# Patient Record
Sex: Female | Born: 1945
Health system: Southern US, Community
[De-identification: ages and names within clinical notes are randomized; demographics above are authoritative.]

## PROBLEM LIST (undated history)

## (undated) DIAGNOSIS — Z6826 Body mass index (BMI) 26.0-26.9, adult: Secondary | ICD-10-CM

## (undated) DIAGNOSIS — I272 Pulmonary hypertension, unspecified: Secondary | ICD-10-CM

## (undated) DIAGNOSIS — J449 Chronic obstructive pulmonary disease, unspecified: Secondary | ICD-10-CM

## (undated) DIAGNOSIS — F419 Anxiety disorder, unspecified: Secondary | ICD-10-CM

## (undated) DIAGNOSIS — I4891 Unspecified atrial fibrillation: Secondary | ICD-10-CM

## (undated) DIAGNOSIS — I10 Essential (primary) hypertension: Secondary | ICD-10-CM

## (undated) DIAGNOSIS — E039 Hypothyroidism, unspecified: Secondary | ICD-10-CM

## (undated) DIAGNOSIS — E785 Hyperlipidemia, unspecified: Secondary | ICD-10-CM

## (undated) DIAGNOSIS — N189 Chronic kidney disease, unspecified: Secondary | ICD-10-CM

## (undated) DIAGNOSIS — I96 Gangrene, not elsewhere classified: Secondary | ICD-10-CM

## (undated) DIAGNOSIS — R600 Localized edema: Secondary | ICD-10-CM

## (undated) DIAGNOSIS — I509 Heart failure, unspecified: Secondary | ICD-10-CM

## (undated) HISTORY — DX: Gangrene, not elsewhere classified: I96

## (undated) HISTORY — DX: Chronic kidney disease, unspecified: N18.9

## (undated) HISTORY — DX: Localized edema: R60.0

## (undated) HISTORY — DX: Chronic obstructive pulmonary disease, unspecified: J44.9

## (undated) HISTORY — DX: Hyperlipidemia, unspecified: E78.5

## (undated) HISTORY — PX: OTHER SURGICAL HISTORY: SHX169

## (undated) HISTORY — DX: Heart failure, unspecified: I50.9

## (undated) HISTORY — PX: ABDOMINAL HYSTERECTOMY: SHX81

## (undated) HISTORY — DX: Unspecified atrial fibrillation: I48.91

## (undated) HISTORY — PX: COLONOSCOPY: SHX174

## (undated) HISTORY — DX: Body mass index (BMI) 26.0-26.9, adult: Z68.26

## (undated) HISTORY — DX: Essential (primary) hypertension: I10

## (undated) HISTORY — DX: Hypothyroidism, unspecified: E03.9

## (undated) HISTORY — DX: Anxiety disorder, unspecified: F41.9

## (undated) HISTORY — DX: Pulmonary hypertension, unspecified: I27.20

---

## 2003-10-16 DIAGNOSIS — C50919 Malignant neoplasm of unspecified site of unspecified female breast: Secondary | ICD-10-CM

## 2003-10-16 HISTORY — PX: BREAST LUMPECTOMY: SHX2

## 2003-10-16 HISTORY — DX: Malignant neoplasm of unspecified site of unspecified female breast: C50.919

## 2018-12-23 DIAGNOSIS — Z6823 Body mass index (BMI) 23.0-23.9, adult: Secondary | ICD-10-CM | POA: Diagnosis not present

## 2018-12-23 DIAGNOSIS — J449 Chronic obstructive pulmonary disease, unspecified: Secondary | ICD-10-CM | POA: Diagnosis not present

## 2018-12-23 DIAGNOSIS — I10 Essential (primary) hypertension: Secondary | ICD-10-CM | POA: Diagnosis not present

## 2019-03-26 DIAGNOSIS — Z0000 Encounter for general adult medical examination without abnormal findings: Secondary | ICD-10-CM | POA: Diagnosis not present

## 2019-03-26 DIAGNOSIS — Z6823 Body mass index (BMI) 23.0-23.9, adult: Secondary | ICD-10-CM | POA: Diagnosis not present

## 2019-03-26 DIAGNOSIS — Z1389 Encounter for screening for other disorder: Secondary | ICD-10-CM | POA: Diagnosis not present

## 2019-03-26 DIAGNOSIS — I10 Essential (primary) hypertension: Secondary | ICD-10-CM | POA: Diagnosis not present

## 2019-03-26 DIAGNOSIS — J301 Allergic rhinitis due to pollen: Secondary | ICD-10-CM | POA: Diagnosis not present

## 2019-03-26 DIAGNOSIS — R69 Illness, unspecified: Secondary | ICD-10-CM | POA: Diagnosis not present

## 2019-03-26 DIAGNOSIS — J449 Chronic obstructive pulmonary disease, unspecified: Secondary | ICD-10-CM | POA: Diagnosis not present

## 2019-03-27 DIAGNOSIS — J449 Chronic obstructive pulmonary disease, unspecified: Secondary | ICD-10-CM | POA: Diagnosis not present

## 2019-03-27 DIAGNOSIS — I10 Essential (primary) hypertension: Secondary | ICD-10-CM | POA: Diagnosis not present

## 2019-03-27 DIAGNOSIS — Z131 Encounter for screening for diabetes mellitus: Secondary | ICD-10-CM | POA: Diagnosis not present

## 2019-03-27 DIAGNOSIS — Z0000 Encounter for general adult medical examination without abnormal findings: Secondary | ICD-10-CM | POA: Diagnosis not present

## 2019-06-25 DIAGNOSIS — I10 Essential (primary) hypertension: Secondary | ICD-10-CM | POA: Diagnosis not present

## 2019-06-25 DIAGNOSIS — R69 Illness, unspecified: Secondary | ICD-10-CM | POA: Diagnosis not present

## 2019-06-25 DIAGNOSIS — Z6824 Body mass index (BMI) 24.0-24.9, adult: Secondary | ICD-10-CM | POA: Diagnosis not present

## 2019-06-25 DIAGNOSIS — J449 Chronic obstructive pulmonary disease, unspecified: Secondary | ICD-10-CM | POA: Diagnosis not present

## 2019-08-10 DIAGNOSIS — R69 Illness, unspecified: Secondary | ICD-10-CM | POA: Diagnosis not present

## 2019-09-24 DIAGNOSIS — R69 Illness, unspecified: Secondary | ICD-10-CM | POA: Diagnosis not present

## 2019-09-24 DIAGNOSIS — I10 Essential (primary) hypertension: Secondary | ICD-10-CM | POA: Diagnosis not present

## 2019-09-24 DIAGNOSIS — J449 Chronic obstructive pulmonary disease, unspecified: Secondary | ICD-10-CM | POA: Diagnosis not present

## 2019-09-24 DIAGNOSIS — Z6824 Body mass index (BMI) 24.0-24.9, adult: Secondary | ICD-10-CM | POA: Diagnosis not present

## 2019-12-23 DIAGNOSIS — R69 Illness, unspecified: Secondary | ICD-10-CM | POA: Diagnosis not present

## 2019-12-23 DIAGNOSIS — Z6825 Body mass index (BMI) 25.0-25.9, adult: Secondary | ICD-10-CM | POA: Diagnosis not present

## 2019-12-23 DIAGNOSIS — J449 Chronic obstructive pulmonary disease, unspecified: Secondary | ICD-10-CM | POA: Diagnosis not present

## 2019-12-23 DIAGNOSIS — I10 Essential (primary) hypertension: Secondary | ICD-10-CM | POA: Diagnosis not present

## 2019-12-25 DIAGNOSIS — R69 Illness, unspecified: Secondary | ICD-10-CM | POA: Diagnosis not present

## 2019-12-25 DIAGNOSIS — J449 Chronic obstructive pulmonary disease, unspecified: Secondary | ICD-10-CM | POA: Diagnosis not present

## 2019-12-25 DIAGNOSIS — Z1159 Encounter for screening for other viral diseases: Secondary | ICD-10-CM | POA: Diagnosis not present

## 2019-12-25 DIAGNOSIS — I10 Essential (primary) hypertension: Secondary | ICD-10-CM | POA: Diagnosis not present

## 2020-03-23 DIAGNOSIS — J449 Chronic obstructive pulmonary disease, unspecified: Secondary | ICD-10-CM | POA: Diagnosis not present

## 2020-03-23 DIAGNOSIS — R69 Illness, unspecified: Secondary | ICD-10-CM | POA: Diagnosis not present

## 2020-03-23 DIAGNOSIS — I10 Essential (primary) hypertension: Secondary | ICD-10-CM | POA: Diagnosis not present

## 2020-03-23 DIAGNOSIS — Z6825 Body mass index (BMI) 25.0-25.9, adult: Secondary | ICD-10-CM | POA: Diagnosis not present

## 2020-06-08 DIAGNOSIS — J449 Chronic obstructive pulmonary disease, unspecified: Secondary | ICD-10-CM | POA: Diagnosis not present

## 2020-06-08 DIAGNOSIS — R69 Illness, unspecified: Secondary | ICD-10-CM | POA: Diagnosis not present

## 2020-06-08 DIAGNOSIS — I10 Essential (primary) hypertension: Secondary | ICD-10-CM | POA: Diagnosis not present

## 2020-06-08 DIAGNOSIS — Z6826 Body mass index (BMI) 26.0-26.9, adult: Secondary | ICD-10-CM | POA: Diagnosis not present

## 2020-06-08 DIAGNOSIS — R600 Localized edema: Secondary | ICD-10-CM | POA: Diagnosis not present

## 2020-06-21 DIAGNOSIS — M79661 Pain in right lower leg: Secondary | ICD-10-CM | POA: Diagnosis not present

## 2020-06-21 DIAGNOSIS — M79604 Pain in right leg: Secondary | ICD-10-CM | POA: Diagnosis not present

## 2020-06-21 DIAGNOSIS — M7989 Other specified soft tissue disorders: Secondary | ICD-10-CM | POA: Diagnosis not present

## 2020-07-05 ENCOUNTER — Encounter: Payer: Self-pay | Admitting: Vascular Surgery

## 2020-07-15 ENCOUNTER — Other Ambulatory Visit: Payer: Self-pay

## 2020-07-15 DIAGNOSIS — I739 Peripheral vascular disease, unspecified: Secondary | ICD-10-CM

## 2020-07-19 ENCOUNTER — Other Ambulatory Visit: Payer: Self-pay

## 2020-07-19 ENCOUNTER — Ambulatory Visit (HOSPITAL_COMMUNITY)
Admission: RE | Admit: 2020-07-19 | Discharge: 2020-07-19 | Disposition: A | Discharge status: Home / Self Care | Payer: Medicare HMO | Source: Ambulatory Visit | Attending: Vascular Surgery | Admitting: Vascular Surgery

## 2020-07-19 ENCOUNTER — Other Ambulatory Visit: Payer: Self-pay | Admitting: Vascular Surgery

## 2020-07-19 DIAGNOSIS — I739 Peripheral vascular disease, unspecified: Secondary | ICD-10-CM | POA: Insufficient documentation

## 2020-07-25 ENCOUNTER — Other Ambulatory Visit: Payer: Self-pay

## 2020-07-25 ENCOUNTER — Encounter: Payer: Self-pay | Admitting: Vascular Surgery

## 2020-07-25 ENCOUNTER — Ambulatory Visit: Payer: Medicare HMO | Admitting: Vascular Surgery

## 2020-07-25 VITALS — BP 143/71 | HR 82 | Temp 98.1°F | Resp 18 | Ht 61.0 in | Wt 138.0 lb

## 2020-07-25 DIAGNOSIS — I739 Peripheral vascular disease, unspecified: Secondary | ICD-10-CM

## 2020-07-25 NOTE — Progress Notes (Signed)
[facility] [place]  Patient name: Stacy Johnson MRN: [id number] DOB: 19-May-1946 Sex: female  REASON FOR CONSULT: Evaluation of right lower extremity swelling and bilateral peripheral vascular occlusive disease  HPI: Stacy Johnson is a 74 y.o. female, who is here today for evaluation of lower extremity arterial disease.  She reports having hip fracture and pinning several years ago and reports that she has had some swelling ever since.  This is been in her right leg only.  This seems to been progressive.  She recently underwent duplex showing normal venous studies with no evidence of DVT.  She also underwent lower extremity arterial evaluation with ankle arm indices at [facility] on 07/19/2020.  This revealed a resting ankle arm index of 0.67 on the right and 0.58 on the left.  There was moderate drop with exercise.  She is here today for further discussion  Past Medical History:  Diagnosis Date  . Anxiety disorder   . Body mass index 26.0-26.9, adult   . Chronic obstructive pulmonary disease (COPD) (HCC)   . Gangrene (HCC)   . Hypertension   . Localized edema     History reviewed. No pertinent family history.  SOCIAL HISTORY: Social History   Socioeconomic History  . Marital status: Widowed    Spouse name: Not on file  . Number of children: Not on file  . Years of education: Not on file  . Highest education level: Not on file  Occupational History  . Not on file  Tobacco Use  . Smoking status: Former Smoker    Types: Cigarettes    Quit date: 10/16/2003    Years since quitting: 16.7  . Smokeless tobacco: Never Used  Substance and Sexual Activity  . Alcohol use: Not Currently  . Drug use: Never  . Sexual activity: Not on file  Other Topics Concern  . Not on file  Social History Narrative  . Not on file   Social Determinants of Health   Financial Resource Strain:   . Difficulty of Paying Living  Expenses: Not on file  Food Insecurity:   . Worried About Programme researcher, broadcasting/film/video in the Last Year: Not on file  . Ran Out of Food in the Last Year: Not on file  Transportation Needs:   . Lack of Transportation (Medical): Not on file  . Lack of Transportation (Non-Medical): Not on file  Physical Activity:   . Days of Exercise per Week: Not on file  . Minutes of Exercise per Session: Not on file  Stress:   . Feeling of Stress : Not on file  Social Connections:   . Frequency of Communication with Friends and Family: Not on file  . Frequency of Social Gatherings with Friends and Family: Not on file  . Attends Religious Services: Not on file  . Active Member of Clubs or Organizations: Not on file  . Attends Banker Meetings: Not on file  . Marital Status: Not on file  Intimate Partner Violence:   . Fear of Current or Ex-Partner: Not on file  . Emotionally Abused: Not on file  . Physically Abused: Not on file  . Sexually Abused: Not on file    No Known Allergies  Current Outpatient Medications  Medication Sig Dispense Refill  . amLODipine (NORVASC) 10 MG tablet     . atorvastatin (LIPITOR) 40 MG tablet Take 40 mg by mouth daily.    . diazepam (VALIUM) 2 MG tablet Take  2 mg by mouth every 12 (twelve) hours as needed for anxiety.    . Fluticasone-Umeclidin-Vilant (TRELEGY ELLIPTA) 100-62.5-25 MCG/INH AEPB Inhale into the lungs.    . irbesartan (AVAPRO) 300 MG tablet      No current facility-administered medications for this visit.    REVIEW OF SYSTEMS:  [X]  denotes positive finding, [ ]  denotes negative finding Cardiac  Comments:  Chest pain or chest pressure:    Shortness of breath upon exertion:    Short of breath when lying flat:    Irregular heart rhythm:        Vascular    Pain in calf, thigh, or hip brought on by ambulation:    Pain in feet at night that wakes you up from your sleep:     Blood clot in your veins:    Leg swelling:  x       Pulmonary      Oxygen at home:    Productive cough:     Wheezing:         Neurologic    Sudden weakness in arms or legs:     Sudden numbness in arms or legs:     Sudden onset of difficulty speaking or slurred speech:    Temporary loss of vision in one eye:     Problems with dizziness:         Gastrointestinal    Blood in stool:     Vomited blood:         Genitourinary    Burning when urinating:     Blood in urine:        Psychiatric    Major depression:         Hematologic    Bleeding problems:    Problems with blood clotting too easily:        Skin    Rashes or ulcers:        Constitutional    Fever or chills:      PHYSICAL EXAM: Vitals:   07/25/20 1405  BP: (!) 143/71  Pulse: 82  Resp: 18  Temp: 98.1 F (36.7 C)  TempSrc: Other (Comment)  SpO2: 92%  Weight: 138 lb (62.6 kg)  Height: 5\' 1"  (1.549 m)    GENERAL: The patient is a well-nourished female, in no acute distress. The vital signs are documented above. CARDIOVASCULAR: 2+ radial pulses bilaterally.  2+ femoral pulses bilaterally.  I do not palpate distal pulses. PULMONARY: There is good air exchange  ABDOMEN: Soft and non-tender  MUSCULOSKELETAL: There are no major deformities or cyanosis. NEUROLOGIC: No focal weakness or paresthesias are detected. SKIN: There are no ulcers or rashes noted. PSYCHIATRIC: The patient has a normal affect.  DATA:  Noninvasive studies noted above with no evidence of DVT with recent venous duplex  Right ABI 0.67 and left 0.58  MEDICAL ISSUES: Unclear as to the etiology of her right leg swelling.  No evidence of DVT but probably does have some element of valvular incompetence.  I did explain the option of compression garment if this is progressive.  She reports that this has somewhat improved.  Regarding her arterial insufficiency.  She has no claudication symptoms and no arterial rest pain.  I explained the signs and symptoms of progressive arterial disease.  She no longer smokes  cigarettes.  She will continue her walking program.  She will see again on an as-needed basis   09/24/20, MD FACS [facility] Tel [phone] Pager [phone]

## 2020-08-18 DIAGNOSIS — R69 Illness, unspecified: Secondary | ICD-10-CM | POA: Diagnosis not present

## 2020-09-19 DIAGNOSIS — J13 Pneumonia due to Streptococcus pneumoniae: Secondary | ICD-10-CM | POA: Diagnosis not present

## 2020-09-19 DIAGNOSIS — E7849 Other hyperlipidemia: Secondary | ICD-10-CM | POA: Diagnosis not present

## 2020-09-19 DIAGNOSIS — J449 Chronic obstructive pulmonary disease, unspecified: Secondary | ICD-10-CM | POA: Diagnosis not present

## 2020-09-19 DIAGNOSIS — Z6827 Body mass index (BMI) 27.0-27.9, adult: Secondary | ICD-10-CM | POA: Diagnosis not present

## 2020-09-19 DIAGNOSIS — R600 Localized edema: Secondary | ICD-10-CM | POA: Diagnosis not present

## 2020-09-19 DIAGNOSIS — I10 Essential (primary) hypertension: Secondary | ICD-10-CM | POA: Diagnosis not present

## 2020-09-19 DIAGNOSIS — R69 Illness, unspecified: Secondary | ICD-10-CM | POA: Diagnosis not present

## 2020-09-19 DIAGNOSIS — J440 Chronic obstructive pulmonary disease with acute lower respiratory infection: Secondary | ICD-10-CM | POA: Diagnosis not present

## 2020-12-13 DIAGNOSIS — K219 Gastro-esophageal reflux disease without esophagitis: Secondary | ICD-10-CM | POA: Diagnosis not present

## 2020-12-13 DIAGNOSIS — E785 Hyperlipidemia, unspecified: Secondary | ICD-10-CM | POA: Diagnosis not present

## 2020-12-13 DIAGNOSIS — R630 Anorexia: Secondary | ICD-10-CM | POA: Diagnosis not present

## 2020-12-13 DIAGNOSIS — R7303 Prediabetes: Secondary | ICD-10-CM | POA: Diagnosis not present

## 2020-12-13 DIAGNOSIS — J449 Chronic obstructive pulmonary disease, unspecified: Secondary | ICD-10-CM | POA: Diagnosis not present

## 2020-12-13 DIAGNOSIS — R0902 Hypoxemia: Secondary | ICD-10-CM | POA: Diagnosis not present

## 2020-12-13 DIAGNOSIS — H9202 Otalgia, left ear: Secondary | ICD-10-CM | POA: Diagnosis not present

## 2020-12-13 DIAGNOSIS — E559 Vitamin D deficiency, unspecified: Secondary | ICD-10-CM | POA: Diagnosis not present

## 2020-12-13 DIAGNOSIS — H6120 Impacted cerumen, unspecified ear: Secondary | ICD-10-CM | POA: Diagnosis not present

## 2020-12-15 DIAGNOSIS — J9601 Acute respiratory failure with hypoxia: Secondary | ICD-10-CM | POA: Diagnosis not present

## 2020-12-15 DIAGNOSIS — J449 Chronic obstructive pulmonary disease, unspecified: Secondary | ICD-10-CM | POA: Diagnosis not present

## 2020-12-20 DIAGNOSIS — N183 Chronic kidney disease, stage 3 unspecified: Secondary | ICD-10-CM | POA: Diagnosis not present

## 2020-12-20 DIAGNOSIS — R630 Anorexia: Secondary | ICD-10-CM | POA: Diagnosis not present

## 2020-12-20 DIAGNOSIS — R0902 Hypoxemia: Secondary | ICD-10-CM | POA: Diagnosis not present

## 2020-12-20 DIAGNOSIS — E876 Hypokalemia: Secondary | ICD-10-CM | POA: Diagnosis not present

## 2020-12-20 DIAGNOSIS — E871 Hypo-osmolality and hyponatremia: Secondary | ICD-10-CM | POA: Diagnosis not present

## 2020-12-20 DIAGNOSIS — I10 Essential (primary) hypertension: Secondary | ICD-10-CM | POA: Diagnosis not present

## 2020-12-20 DIAGNOSIS — J449 Chronic obstructive pulmonary disease, unspecified: Secondary | ICD-10-CM | POA: Diagnosis not present

## 2020-12-20 DIAGNOSIS — Z7689 Persons encountering health services in other specified circumstances: Secondary | ICD-10-CM | POA: Diagnosis not present

## 2020-12-20 DIAGNOSIS — H9202 Otalgia, left ear: Secondary | ICD-10-CM | POA: Diagnosis not present

## 2020-12-20 DIAGNOSIS — K219 Gastro-esophageal reflux disease without esophagitis: Secondary | ICD-10-CM | POA: Diagnosis not present

## 2021-01-15 DIAGNOSIS — J9601 Acute respiratory failure with hypoxia: Secondary | ICD-10-CM | POA: Diagnosis not present

## 2021-01-15 DIAGNOSIS — J449 Chronic obstructive pulmonary disease, unspecified: Secondary | ICD-10-CM | POA: Diagnosis not present

## 2021-01-19 DIAGNOSIS — R000 Tachycardia, unspecified: Secondary | ICD-10-CM | POA: Diagnosis not present

## 2021-01-19 DIAGNOSIS — I10 Essential (primary) hypertension: Secondary | ICD-10-CM | POA: Diagnosis not present

## 2021-01-19 DIAGNOSIS — R0902 Hypoxemia: Secondary | ICD-10-CM | POA: Diagnosis not present

## 2021-01-19 DIAGNOSIS — K219 Gastro-esophageal reflux disease without esophagitis: Secondary | ICD-10-CM | POA: Diagnosis not present

## 2021-01-19 DIAGNOSIS — R69 Illness, unspecified: Secondary | ICD-10-CM | POA: Diagnosis not present

## 2021-01-19 DIAGNOSIS — J449 Chronic obstructive pulmonary disease, unspecified: Secondary | ICD-10-CM | POA: Diagnosis not present

## 2021-01-19 DIAGNOSIS — R630 Anorexia: Secondary | ICD-10-CM | POA: Diagnosis not present

## 2021-01-19 DIAGNOSIS — N183 Chronic kidney disease, stage 3 unspecified: Secondary | ICD-10-CM | POA: Diagnosis not present

## 2021-02-07 DIAGNOSIS — R600 Localized edema: Secondary | ICD-10-CM | POA: Diagnosis not present

## 2021-02-07 DIAGNOSIS — J449 Chronic obstructive pulmonary disease, unspecified: Secondary | ICD-10-CM | POA: Diagnosis not present

## 2021-02-07 DIAGNOSIS — R0902 Hypoxemia: Secondary | ICD-10-CM | POA: Diagnosis not present

## 2021-02-14 DIAGNOSIS — J9601 Acute respiratory failure with hypoxia: Secondary | ICD-10-CM | POA: Diagnosis not present

## 2021-02-14 DIAGNOSIS — J449 Chronic obstructive pulmonary disease, unspecified: Secondary | ICD-10-CM | POA: Diagnosis not present

## 2021-02-15 ENCOUNTER — Other Ambulatory Visit: Payer: Self-pay

## 2021-02-15 ENCOUNTER — Encounter: Payer: Self-pay | Admitting: Internal Medicine

## 2021-02-15 ENCOUNTER — Ambulatory Visit (INDEPENDENT_AMBULATORY_CARE_PROVIDER_SITE_OTHER): Payer: Medicare HMO | Admitting: Internal Medicine

## 2021-02-15 ENCOUNTER — Other Ambulatory Visit: Payer: Self-pay | Admitting: Internal Medicine

## 2021-02-15 DIAGNOSIS — J9611 Chronic respiratory failure with hypoxia: Secondary | ICD-10-CM

## 2021-02-15 DIAGNOSIS — I4891 Unspecified atrial fibrillation: Secondary | ICD-10-CM | POA: Diagnosis not present

## 2021-02-15 DIAGNOSIS — R0609 Other forms of dyspnea: Secondary | ICD-10-CM

## 2021-02-15 DIAGNOSIS — J439 Emphysema, unspecified: Secondary | ICD-10-CM | POA: Insufficient documentation

## 2021-02-15 DIAGNOSIS — R0600 Dyspnea, unspecified: Secondary | ICD-10-CM | POA: Diagnosis not present

## 2021-02-15 NOTE — Patient Instructions (Addendum)
I  strongly recommend you go to [facility] today for evaluation for rapid atrial fibrillation which is dangerous and needs immediate evaluation by your heart doctor  at [facility].  You need to wear your 0xygen 2lpm 24/7  And we will contact  [employer] to provide it though if you go to the hospital now they can arrange for the correct flow at discharge  >>>   The goal is to keep your 02 saturation above 90% at all times.  I can see you back here once your cardiac evaluation is complete.

## 2021-02-15 NOTE — Progress Notes (Signed)
Stacy Johnson, female    DOB: 03-30-1946,    MRN: [id number]   Brief patient profile:  48 yowf quit smoking 2009 p pneumonia  Completely recovered activity tolerance but had "scars on lungs" since then and then around 2017/18 while in [place]  [place] developed progressive doe and seen by ? Pulmonary doctor dx of copd rx trelegy seemed better but downhill since flu shot oct 2021 with worse sob generalized swelling so referred to pulmonary clinic in [place]  02/15/2021 by Dr   Fanny Bien awaiting cards eval in June 2022 at [facility].    History of Present Illness  02/15/2021  Pulmonary/ 1st office eval/ Stacy Johnson / [place] [facility]  Chief Complaint  Patient presents with  . Pulmonary Consult    Referred by Dr Fanny Bien. Pt states developed PNA Oct 2021- having increased SOB since then. She states that she get SOB when she gets in a hurry.    Dyspnea:  amb inside house mostly but did foodlion one week prior to OV  s 02 and doesn't feel her heart racing Cough: none  Sleep: on flat bed with one pillow does fine on 4lpm rx per PCP but not using much in daytime SABA use: none today / last trelegy was 02/14/21  - has neb not using.   No obvious day to day or daytime variability or assoc excess/ purulent sputum or mucus plugs or hemoptysis or cp or chest tightness, subjective wheeze or overt sinus or hb symptoms.    Also denies any obvious fluctuation of symptoms with weather or environmental changes or other aggravating or alleviating factors except as outlined above   No unusual exposure hx or h/o childhood pna/ asthma or knowledge of premature birth.  Current Allergies, Complete Past Medical History, Past Surgical History, Family History, and Social History were reviewed in [employer] record.  ROS  The following are not active complaints unless bolded Hoarseness, sore throat, dysphagia, dental problems, itching, sneezing,  nasal congestion or discharge of excess mucus  or purulent secretions, ear ache,   fever, chills, sweats, unintended wt loss or wt gain, classically pleuritic or exertional cp,  orthopnea pnd or arm/hand swelling  or leg swelling, presyncope, palpitations, abdominal pain, anorexia, nausea, vomiting, diarrhea  or change in bowel habits or change in bladder habits, change in stools or change in urine, dysuria, hematuria,  rash, arthralgias, visual complaints, headache, numbness, weakness or ataxia or problems with walking or coordination,  change in mood or  memory.           Past Medical History:  Diagnosis Date  . Anxiety disorder   . Body mass index 26.0-26.9, adult   . Chronic obstructive pulmonary disease (COPD) (HCC)   . Gangrene (HCC)   . Hypertension   . Localized edema     Outpatient Medications Prior to Visit  Medication Sig Dispense Refill  . albuterol (ACCUNEB) 1.25 MG/3ML nebulizer solution Take 1 ampule by nebulization every 6 (six) hours as needed for wheezing.    . ALBUTEROL IN Inhale 2 puffs into the lungs every 4 (four) hours as needed.    Marland Kitchen azithromycin (ZITHROMAX) 250 MG tablet Take 250 mg by mouth daily.    Marland Kitchen escitalopram (LEXAPRO) 10 MG tablet Take 10 mg by mouth daily.    Marland Kitchen ETHACRYNIC ACID PO Take 25 mg by mouth daily.    . Fluticasone-Umeclidin-Vilant (TRELEGY ELLIPTA) 100-62.5-25 MCG/INH AEPB Inhale into the lungs.    . indapamide (LOZOL) 1.25  MG tablet Take 1.25 mg by mouth daily.    Marland Kitchen amLODipine (NORVASC) 10 MG tablet     . atorvastatin (LIPITOR) 40 MG tablet Take 40 mg by mouth daily.    . diazepam (VALIUM) 2 MG tablet Take 2 mg by mouth every 12 (twelve) hours as needed for anxiety.    . irbesartan (AVAPRO) 300 MG tablet      No facility-administered medications prior to visit.     Objective:     BP 124/84 (BP Location: Left Arm, Cuff Size: Normal)   Pulse (!) 160   Temp 98.6 F (37 C) (Temporal)   Ht 5\' 1"  (1.549 m)   Wt 135 lb (61.2 kg)   SpO2 96%   BMI 25.51 kg/m   SpO2: 96 % O2 Type:  Continuous O2 O2 Flow Rate (L/min): 2 L/min   amb chronically ill appearing wf  Pwho failed to answer a single question asked in a straightforward manner, tending to go off on tangents or answer questions with ambiguous medical terms or diagnoses "pneumonia  Copd"  and seemed perplexed  when asked the same question more than once for clarification.   HEENT : pt wearing mask not removed for exam due to covid - 19 concerns.   NECK :  without JVD/Nodes/TM/ nl carotid upstrokes bilaterally   LUNGS: no acc muscle use,  Min barrel  contour chest wall with bilateral  slightly decreased bs s audible wheeze and  without cough on insp or exp maneuvers and min  Hyperresonant  to  percussion bilaterally     CV:  IRIR apical pulse varies as high as 150 s increase in P2, and  Trace bilateral sym LE edema    ABD:  soft and nontender with pos end  insp Hoover's  in the supine position. No bruits or organomegaly appreciated, bowel sounds nl  MS:   Nl gait/  ext warm without deformities, calf tenderness, cyanosis or clubbing No obvious joint restrictions   SKIN: warm and dry without lesions    NEURO:  alert, approp, nl sensorium with  no motor or cerebellar deficits apparent.    CD's from [place] 2018 and 2019 not compatible with our system/ reports requested.     Assessment   DOE (dyspnea on exertion) Quit smoking 2009 @ dx of severe pna but says 100% recovered  - onset 2017/18 eval in [place] dx copd, some better on trelegy - much worse since flu shot Oct 2021  - 02/15/2021 rapid afib / desats on RA > rec admit [facility]   I strongly doubt she has significant copd though may have some PF  > for now from pulmonary perspective main goal is to correct the hypoxemia (see separate a/p)   Hold trelegy and just use saba prn sob at rest as risk aggravating RAF    Chronic respiratory failure with hypoxia (HCC)  02/15/2021   Walked 2lpm   approx   100 ft  @ moderate pace  stopped due to pulse 170   No sob with sats  still 98%  Vs sats 86% RA (clinically 02 dep since 11/2020   >>> have ordered portable 02 2lpm with f/u p cards eval complete as may have underlying PF vs this is mostly a cardiac problem which needs to be addressed first      Atrial fibrillation with rapid ventricular response (HCC) Noted at pulmonary clinic 02/15/2021 > referred back to [facility] hosp/[place]   Each maintenance medication was reviewed in detail including  emphasizing most importantly the difference between maintenance and prns and under what circumstances the prns are to be triggered using an action plan format where appropriate.  Total time for H and P, chart review, counseling, reviewing elipta/hfa/02 device(s) , directly observing portions of ambulatory 02 saturation study/ and generating customized AVS unique to this office visit / same day charting = 60 min         Sandrea Hughs, MD 02/15/2021

## 2021-02-15 NOTE — Assessment & Plan Note (Signed)
Noted at pulmonary clinic 02/15/2021 > referred back to [facility] hosp/[place]   Each maintenance medication was reviewed in detail including emphasizing most importantly the difference between maintenance and prns and under what circumstances the prns are to be triggered using an action plan format where appropriate.  Total time for H and P, chart review, counseling, reviewing elipta/hfa/02 device(s) , directly observing portions of ambulatory 02 saturation study/ and generating customized AVS unique to this office visit / same day charting = 60 min

## 2021-02-15 NOTE — Assessment & Plan Note (Addendum)
02/15/2021   Walked 2lpm   approx   100 ft  @ moderate pace  stopped due to pulse 170   No sob with sats still 98%  Vs sats 86% RA (clinically 02 dep since 11/2020   >>> have ordered portable 02 2lpm with f/u p cards eval complete as may have underlying PF vs this is mostly a cardiac problem which needs to be addressed first

## 2021-02-15 NOTE — Assessment & Plan Note (Signed)
Quit smoking 2009 @ dx of severe pna but says 100% recovered  - onset 2017/18 eval in [place] dx copd, some better on trelegy - much worse since flu shot Oct 2021  - 02/15/2021 rapid afib / desats on RA > rec admit [facility]   I strongly doubt she has significant copd though may have some PF  > for now from pulmonary perspective main goal is to correct the hypoxemia (see separate a/p)   Hold trelegy and just use saba prn sob at rest as risk aggravating RAF

## 2021-02-21 DIAGNOSIS — J449 Chronic obstructive pulmonary disease, unspecified: Secondary | ICD-10-CM | POA: Diagnosis not present

## 2021-02-21 DIAGNOSIS — R600 Localized edema: Secondary | ICD-10-CM | POA: Diagnosis not present

## 2021-02-21 DIAGNOSIS — R0902 Hypoxemia: Secondary | ICD-10-CM | POA: Diagnosis not present

## 2021-02-23 DIAGNOSIS — I272 Pulmonary hypertension, unspecified: Secondary | ICD-10-CM | POA: Diagnosis not present

## 2021-02-23 DIAGNOSIS — R0902 Hypoxemia: Secondary | ICD-10-CM | POA: Diagnosis not present

## 2021-02-23 DIAGNOSIS — I4891 Unspecified atrial fibrillation: Secondary | ICD-10-CM | POA: Diagnosis not present

## 2021-02-23 DIAGNOSIS — R600 Localized edema: Secondary | ICD-10-CM | POA: Diagnosis not present

## 2021-02-23 DIAGNOSIS — I081 Rheumatic disorders of both mitral and tricuspid valves: Secondary | ICD-10-CM | POA: Diagnosis not present

## 2021-02-23 DIAGNOSIS — I088 Other rheumatic multiple valve diseases: Secondary | ICD-10-CM | POA: Diagnosis not present

## 2021-02-23 DIAGNOSIS — J449 Chronic obstructive pulmonary disease, unspecified: Secondary | ICD-10-CM | POA: Diagnosis not present

## 2021-02-24 DIAGNOSIS — I5021 Acute systolic (congestive) heart failure: Secondary | ICD-10-CM | POA: Diagnosis not present

## 2021-02-24 DIAGNOSIS — I4891 Unspecified atrial fibrillation: Secondary | ICD-10-CM | POA: Diagnosis not present

## 2021-02-24 DIAGNOSIS — I10 Essential (primary) hypertension: Secondary | ICD-10-CM | POA: Diagnosis not present

## 2021-02-27 DIAGNOSIS — I4891 Unspecified atrial fibrillation: Secondary | ICD-10-CM | POA: Diagnosis not present

## 2021-02-27 DIAGNOSIS — I5021 Acute systolic (congestive) heart failure: Secondary | ICD-10-CM | POA: Diagnosis not present

## 2021-03-01 DIAGNOSIS — R600 Localized edema: Secondary | ICD-10-CM | POA: Diagnosis not present

## 2021-03-01 DIAGNOSIS — R0902 Hypoxemia: Secondary | ICD-10-CM | POA: Diagnosis not present

## 2021-03-01 DIAGNOSIS — J449 Chronic obstructive pulmonary disease, unspecified: Secondary | ICD-10-CM | POA: Diagnosis not present

## 2021-03-07 DIAGNOSIS — I482 Chronic atrial fibrillation, unspecified: Secondary | ICD-10-CM | POA: Diagnosis not present

## 2021-03-07 DIAGNOSIS — R69 Illness, unspecified: Secondary | ICD-10-CM | POA: Diagnosis not present

## 2021-03-07 DIAGNOSIS — R600 Localized edema: Secondary | ICD-10-CM | POA: Diagnosis not present

## 2021-03-07 DIAGNOSIS — I5020 Unspecified systolic (congestive) heart failure: Secondary | ICD-10-CM | POA: Diagnosis not present

## 2021-03-07 DIAGNOSIS — J449 Chronic obstructive pulmonary disease, unspecified: Secondary | ICD-10-CM | POA: Diagnosis not present

## 2021-03-07 DIAGNOSIS — R0902 Hypoxemia: Secondary | ICD-10-CM | POA: Diagnosis not present

## 2021-03-08 DIAGNOSIS — I4891 Unspecified atrial fibrillation: Secondary | ICD-10-CM | POA: Diagnosis not present

## 2021-03-20 DIAGNOSIS — I4891 Unspecified atrial fibrillation: Secondary | ICD-10-CM | POA: Diagnosis not present

## 2021-03-23 DIAGNOSIS — I10 Essential (primary) hypertension: Secondary | ICD-10-CM | POA: Diagnosis not present

## 2021-03-23 DIAGNOSIS — I472 Ventricular tachycardia: Secondary | ICD-10-CM | POA: Diagnosis not present

## 2021-03-23 DIAGNOSIS — I4891 Unspecified atrial fibrillation: Secondary | ICD-10-CM | POA: Diagnosis not present

## 2021-03-23 DIAGNOSIS — I4819 Other persistent atrial fibrillation: Secondary | ICD-10-CM | POA: Diagnosis not present

## 2021-03-23 DIAGNOSIS — I5042 Chronic combined systolic (congestive) and diastolic (congestive) heart failure: Secondary | ICD-10-CM | POA: Diagnosis not present

## 2021-03-23 DIAGNOSIS — I493 Ventricular premature depolarization: Secondary | ICD-10-CM | POA: Diagnosis not present

## 2021-04-01 DIAGNOSIS — R0902 Hypoxemia: Secondary | ICD-10-CM | POA: Diagnosis not present

## 2021-04-01 DIAGNOSIS — J449 Chronic obstructive pulmonary disease, unspecified: Secondary | ICD-10-CM | POA: Diagnosis not present

## 2021-04-01 DIAGNOSIS — R600 Localized edema: Secondary | ICD-10-CM | POA: Diagnosis not present

## 2021-04-06 DIAGNOSIS — I4891 Unspecified atrial fibrillation: Secondary | ICD-10-CM | POA: Diagnosis not present

## 2021-04-06 DIAGNOSIS — I5021 Acute systolic (congestive) heart failure: Secondary | ICD-10-CM | POA: Diagnosis not present

## 2021-04-06 DIAGNOSIS — I10 Essential (primary) hypertension: Secondary | ICD-10-CM | POA: Diagnosis not present

## 2021-05-01 DIAGNOSIS — J449 Chronic obstructive pulmonary disease, unspecified: Secondary | ICD-10-CM | POA: Diagnosis not present

## 2021-05-01 DIAGNOSIS — R600 Localized edema: Secondary | ICD-10-CM | POA: Diagnosis not present

## 2021-05-01 DIAGNOSIS — R0902 Hypoxemia: Secondary | ICD-10-CM | POA: Diagnosis not present

## 2021-05-08 ENCOUNTER — Ambulatory Visit: Payer: Medicare HMO | Admitting: Cardiology

## 2021-06-01 DIAGNOSIS — J449 Chronic obstructive pulmonary disease, unspecified: Secondary | ICD-10-CM | POA: Diagnosis not present

## 2021-06-01 DIAGNOSIS — R600 Localized edema: Secondary | ICD-10-CM | POA: Diagnosis not present

## 2021-06-01 DIAGNOSIS — R0902 Hypoxemia: Secondary | ICD-10-CM | POA: Diagnosis not present

## 2021-06-06 DIAGNOSIS — I482 Chronic atrial fibrillation, unspecified: Secondary | ICD-10-CM | POA: Diagnosis not present

## 2021-06-20 DIAGNOSIS — I5020 Unspecified systolic (congestive) heart failure: Secondary | ICD-10-CM | POA: Diagnosis not present

## 2021-06-20 DIAGNOSIS — I482 Chronic atrial fibrillation, unspecified: Secondary | ICD-10-CM | POA: Diagnosis not present

## 2021-07-02 DIAGNOSIS — R600 Localized edema: Secondary | ICD-10-CM | POA: Diagnosis not present

## 2021-07-02 DIAGNOSIS — R0902 Hypoxemia: Secondary | ICD-10-CM | POA: Diagnosis not present

## 2021-07-02 DIAGNOSIS — J449 Chronic obstructive pulmonary disease, unspecified: Secondary | ICD-10-CM | POA: Diagnosis not present

## 2021-07-11 DIAGNOSIS — I482 Chronic atrial fibrillation, unspecified: Secondary | ICD-10-CM | POA: Diagnosis not present

## 2021-07-25 DIAGNOSIS — I5020 Unspecified systolic (congestive) heart failure: Secondary | ICD-10-CM | POA: Diagnosis not present

## 2021-07-25 DIAGNOSIS — I482 Chronic atrial fibrillation, unspecified: Secondary | ICD-10-CM | POA: Diagnosis not present

## 2021-08-01 DIAGNOSIS — R0902 Hypoxemia: Secondary | ICD-10-CM | POA: Diagnosis not present

## 2021-08-01 DIAGNOSIS — J449 Chronic obstructive pulmonary disease, unspecified: Secondary | ICD-10-CM | POA: Diagnosis not present

## 2021-08-01 DIAGNOSIS — R600 Localized edema: Secondary | ICD-10-CM | POA: Diagnosis not present

## 2021-08-08 DIAGNOSIS — I482 Chronic atrial fibrillation, unspecified: Secondary | ICD-10-CM | POA: Diagnosis not present

## 2021-09-01 DIAGNOSIS — R600 Localized edema: Secondary | ICD-10-CM | POA: Diagnosis not present

## 2021-09-01 DIAGNOSIS — R0902 Hypoxemia: Secondary | ICD-10-CM | POA: Diagnosis not present

## 2021-09-01 DIAGNOSIS — J449 Chronic obstructive pulmonary disease, unspecified: Secondary | ICD-10-CM | POA: Diagnosis not present

## 2021-09-05 DIAGNOSIS — I482 Chronic atrial fibrillation, unspecified: Secondary | ICD-10-CM | POA: Diagnosis not present

## 2021-09-30 DIAGNOSIS — M25561 Pain in right knee: Secondary | ICD-10-CM | POA: Diagnosis not present

## 2021-10-01 DIAGNOSIS — R0902 Hypoxemia: Secondary | ICD-10-CM | POA: Diagnosis not present

## 2021-10-01 DIAGNOSIS — J449 Chronic obstructive pulmonary disease, unspecified: Secondary | ICD-10-CM | POA: Diagnosis not present

## 2021-10-01 DIAGNOSIS — R600 Localized edema: Secondary | ICD-10-CM | POA: Diagnosis not present

## 2021-11-01 DIAGNOSIS — J449 Chronic obstructive pulmonary disease, unspecified: Secondary | ICD-10-CM | POA: Diagnosis not present

## 2021-11-01 DIAGNOSIS — R600 Localized edema: Secondary | ICD-10-CM | POA: Diagnosis not present

## 2021-11-01 DIAGNOSIS — R0902 Hypoxemia: Secondary | ICD-10-CM | POA: Diagnosis not present

## 2021-11-08 DIAGNOSIS — I482 Chronic atrial fibrillation, unspecified: Secondary | ICD-10-CM | POA: Diagnosis not present

## 2021-11-24 DIAGNOSIS — H5789 Other specified disorders of eye and adnexa: Secondary | ICD-10-CM | POA: Diagnosis not present

## 2021-12-02 DIAGNOSIS — R0902 Hypoxemia: Secondary | ICD-10-CM | POA: Diagnosis not present

## 2021-12-02 DIAGNOSIS — R600 Localized edema: Secondary | ICD-10-CM | POA: Diagnosis not present

## 2021-12-02 DIAGNOSIS — J449 Chronic obstructive pulmonary disease, unspecified: Secondary | ICD-10-CM | POA: Diagnosis not present

## 2021-12-12 ENCOUNTER — Other Ambulatory Visit (HOSPITAL_COMMUNITY): Payer: Self-pay | Admitting: Family Medicine

## 2021-12-12 DIAGNOSIS — E876 Hypokalemia: Secondary | ICD-10-CM | POA: Diagnosis not present

## 2021-12-12 DIAGNOSIS — I10 Essential (primary) hypertension: Secondary | ICD-10-CM | POA: Diagnosis not present

## 2021-12-12 DIAGNOSIS — Z853 Personal history of malignant neoplasm of breast: Secondary | ICD-10-CM | POA: Diagnosis not present

## 2021-12-12 DIAGNOSIS — I5020 Unspecified systolic (congestive) heart failure: Secondary | ICD-10-CM | POA: Diagnosis not present

## 2021-12-12 DIAGNOSIS — Z1231 Encounter for screening mammogram for malignant neoplasm of breast: Secondary | ICD-10-CM

## 2021-12-12 DIAGNOSIS — J449 Chronic obstructive pulmonary disease, unspecified: Secondary | ICD-10-CM | POA: Diagnosis not present

## 2021-12-12 DIAGNOSIS — E871 Hypo-osmolality and hyponatremia: Secondary | ICD-10-CM | POA: Diagnosis not present

## 2021-12-12 DIAGNOSIS — N183 Chronic kidney disease, stage 3 unspecified: Secondary | ICD-10-CM | POA: Diagnosis not present

## 2021-12-12 DIAGNOSIS — I482 Chronic atrial fibrillation, unspecified: Secondary | ICD-10-CM | POA: Diagnosis not present

## 2021-12-19 DIAGNOSIS — I5020 Unspecified systolic (congestive) heart failure: Secondary | ICD-10-CM | POA: Diagnosis not present

## 2021-12-19 DIAGNOSIS — I5030 Unspecified diastolic (congestive) heart failure: Secondary | ICD-10-CM | POA: Diagnosis not present

## 2021-12-19 DIAGNOSIS — I351 Nonrheumatic aortic (valve) insufficiency: Secondary | ICD-10-CM | POA: Diagnosis not present

## 2021-12-19 DIAGNOSIS — I482 Chronic atrial fibrillation, unspecified: Secondary | ICD-10-CM | POA: Diagnosis not present

## 2021-12-19 DIAGNOSIS — I272 Pulmonary hypertension, unspecified: Secondary | ICD-10-CM | POA: Diagnosis not present

## 2021-12-19 DIAGNOSIS — I371 Nonrheumatic pulmonary valve insufficiency: Secondary | ICD-10-CM | POA: Diagnosis not present

## 2021-12-19 DIAGNOSIS — I071 Rheumatic tricuspid insufficiency: Secondary | ICD-10-CM | POA: Diagnosis not present

## 2021-12-27 ENCOUNTER — Encounter (HOSPITAL_COMMUNITY): Payer: Self-pay

## 2021-12-27 ENCOUNTER — Other Ambulatory Visit: Payer: Self-pay

## 2021-12-27 ENCOUNTER — Ambulatory Visit (HOSPITAL_COMMUNITY)
Admission: RE | Admit: 2021-12-27 | Discharge: 2021-12-27 | Disposition: A | Discharge status: Home / Self Care | Payer: Medicare HMO | Source: Ambulatory Visit | Attending: Family Medicine | Admitting: Family Medicine

## 2021-12-27 DIAGNOSIS — Z1231 Encounter for screening mammogram for malignant neoplasm of breast: Secondary | ICD-10-CM | POA: Insufficient documentation

## 2021-12-30 DIAGNOSIS — R0902 Hypoxemia: Secondary | ICD-10-CM | POA: Diagnosis not present

## 2021-12-30 DIAGNOSIS — R600 Localized edema: Secondary | ICD-10-CM | POA: Diagnosis not present

## 2021-12-30 DIAGNOSIS — J449 Chronic obstructive pulmonary disease, unspecified: Secondary | ICD-10-CM | POA: Diagnosis not present

## 2022-01-01 ENCOUNTER — Inpatient Hospital Stay
Admission: RE | Admit: 2022-01-01 | Discharge: 2022-01-01 | Disposition: A | Discharge status: Home / Self Care | Payer: Self-pay | Source: Ambulatory Visit | Attending: Family Medicine | Admitting: Family Medicine

## 2022-01-01 ENCOUNTER — Other Ambulatory Visit (HOSPITAL_COMMUNITY): Payer: Self-pay | Admitting: Family Medicine

## 2022-01-01 DIAGNOSIS — Z1231 Encounter for screening mammogram for malignant neoplasm of breast: Secondary | ICD-10-CM

## 2022-01-03 ENCOUNTER — Other Ambulatory Visit (HOSPITAL_COMMUNITY): Payer: Self-pay | Admitting: Family Medicine

## 2022-01-03 DIAGNOSIS — N6320 Unspecified lump in the left breast, unspecified quadrant: Secondary | ICD-10-CM

## 2022-01-30 ENCOUNTER — Ambulatory Visit (HOSPITAL_COMMUNITY)
Admission: RE | Admit: 2022-01-30 | Discharge: 2022-01-30 | Disposition: A | Discharge status: Home / Self Care | Payer: Medicare HMO | Source: Ambulatory Visit | Attending: Family Medicine | Admitting: Family Medicine

## 2022-01-30 ENCOUNTER — Encounter (HOSPITAL_COMMUNITY): Payer: Self-pay | Admitting: Radiology

## 2022-01-30 DIAGNOSIS — Z9889 Other specified postprocedural states: Secondary | ICD-10-CM | POA: Diagnosis not present

## 2022-01-30 DIAGNOSIS — J449 Chronic obstructive pulmonary disease, unspecified: Secondary | ICD-10-CM | POA: Diagnosis not present

## 2022-01-30 DIAGNOSIS — Z1231 Encounter for screening mammogram for malignant neoplasm of breast: Secondary | ICD-10-CM | POA: Diagnosis not present

## 2022-01-30 DIAGNOSIS — N6320 Unspecified lump in the left breast, unspecified quadrant: Secondary | ICD-10-CM

## 2022-01-30 DIAGNOSIS — N6332 Unspecified lump in axillary tail of the left breast: Secondary | ICD-10-CM | POA: Diagnosis not present

## 2022-01-30 DIAGNOSIS — R928 Other abnormal and inconclusive findings on diagnostic imaging of breast: Secondary | ICD-10-CM | POA: Diagnosis not present

## 2022-01-30 DIAGNOSIS — R0902 Hypoxemia: Secondary | ICD-10-CM | POA: Diagnosis not present

## 2022-01-30 DIAGNOSIS — R600 Localized edema: Secondary | ICD-10-CM | POA: Diagnosis not present

## 2022-01-30 DIAGNOSIS — Z853 Personal history of malignant neoplasm of breast: Secondary | ICD-10-CM | POA: Diagnosis not present

## 2022-02-06 DIAGNOSIS — H6092 Unspecified otitis externa, left ear: Secondary | ICD-10-CM | POA: Diagnosis not present

## 2022-02-06 DIAGNOSIS — Z6823 Body mass index (BMI) 23.0-23.9, adult: Secondary | ICD-10-CM | POA: Diagnosis not present

## 2022-02-06 DIAGNOSIS — I10 Essential (primary) hypertension: Secondary | ICD-10-CM | POA: Diagnosis not present

## 2022-02-06 DIAGNOSIS — H9202 Otalgia, left ear: Secondary | ICD-10-CM | POA: Diagnosis not present

## 2022-03-01 DIAGNOSIS — R0902 Hypoxemia: Secondary | ICD-10-CM | POA: Diagnosis not present

## 2022-03-01 DIAGNOSIS — R600 Localized edema: Secondary | ICD-10-CM | POA: Diagnosis not present

## 2022-03-01 DIAGNOSIS — J449 Chronic obstructive pulmonary disease, unspecified: Secondary | ICD-10-CM | POA: Diagnosis not present

## 2022-04-01 DIAGNOSIS — R0902 Hypoxemia: Secondary | ICD-10-CM | POA: Diagnosis not present

## 2022-04-01 DIAGNOSIS — J449 Chronic obstructive pulmonary disease, unspecified: Secondary | ICD-10-CM | POA: Diagnosis not present

## 2022-04-01 DIAGNOSIS — R600 Localized edema: Secondary | ICD-10-CM | POA: Diagnosis not present

## 2022-05-01 DIAGNOSIS — R0902 Hypoxemia: Secondary | ICD-10-CM | POA: Diagnosis not present

## 2022-05-01 DIAGNOSIS — R600 Localized edema: Secondary | ICD-10-CM | POA: Diagnosis not present

## 2022-05-01 DIAGNOSIS — J449 Chronic obstructive pulmonary disease, unspecified: Secondary | ICD-10-CM | POA: Diagnosis not present

## 2022-06-01 DIAGNOSIS — R0902 Hypoxemia: Secondary | ICD-10-CM | POA: Diagnosis not present

## 2022-06-01 DIAGNOSIS — J449 Chronic obstructive pulmonary disease, unspecified: Secondary | ICD-10-CM | POA: Diagnosis not present

## 2022-06-01 DIAGNOSIS — R600 Localized edema: Secondary | ICD-10-CM | POA: Diagnosis not present

## 2022-07-02 DIAGNOSIS — R600 Localized edema: Secondary | ICD-10-CM | POA: Diagnosis not present

## 2022-07-02 DIAGNOSIS — J449 Chronic obstructive pulmonary disease, unspecified: Secondary | ICD-10-CM | POA: Diagnosis not present

## 2022-07-02 DIAGNOSIS — R0902 Hypoxemia: Secondary | ICD-10-CM | POA: Diagnosis not present

## 2022-07-27 IMAGING — MG DIGITAL DIAGNOSTIC BILAT W/ TOMO W/ CAD
6 of 10 series · 6 of 30 positions shown · non-contrast
Comparison: Previous exams.

CLINICAL DATA: 75-year-old female with a palpable area of concern
in the left axilla. History of right lumpectomy.

EXAM:
DIGITAL DIAGNOSTIC BILATERAL MAMMOGRAM WITH TOMOSYNTHESIS AND CAD
TECHNIQUE: Bilateral digital diagnostic mammography and breast tomosynthesis
was performed. The images were evaluated with computer-aided
detection.

[L MLO synth-2D]
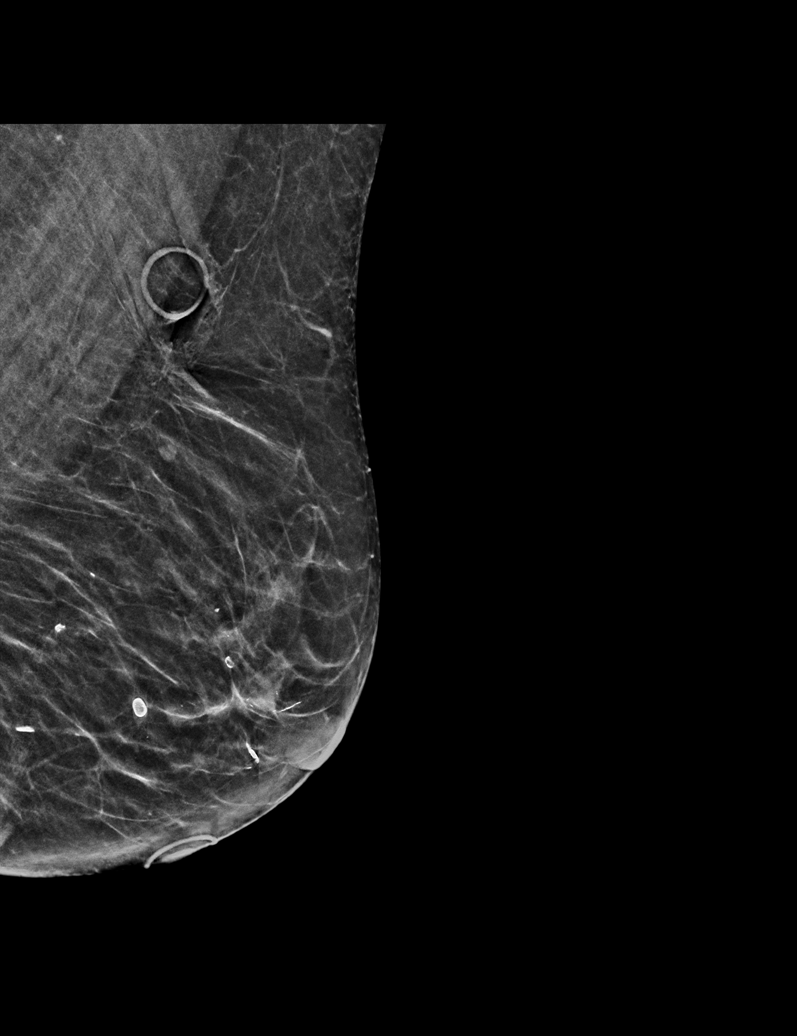

[R MLO synth-2D (1 of 2)]
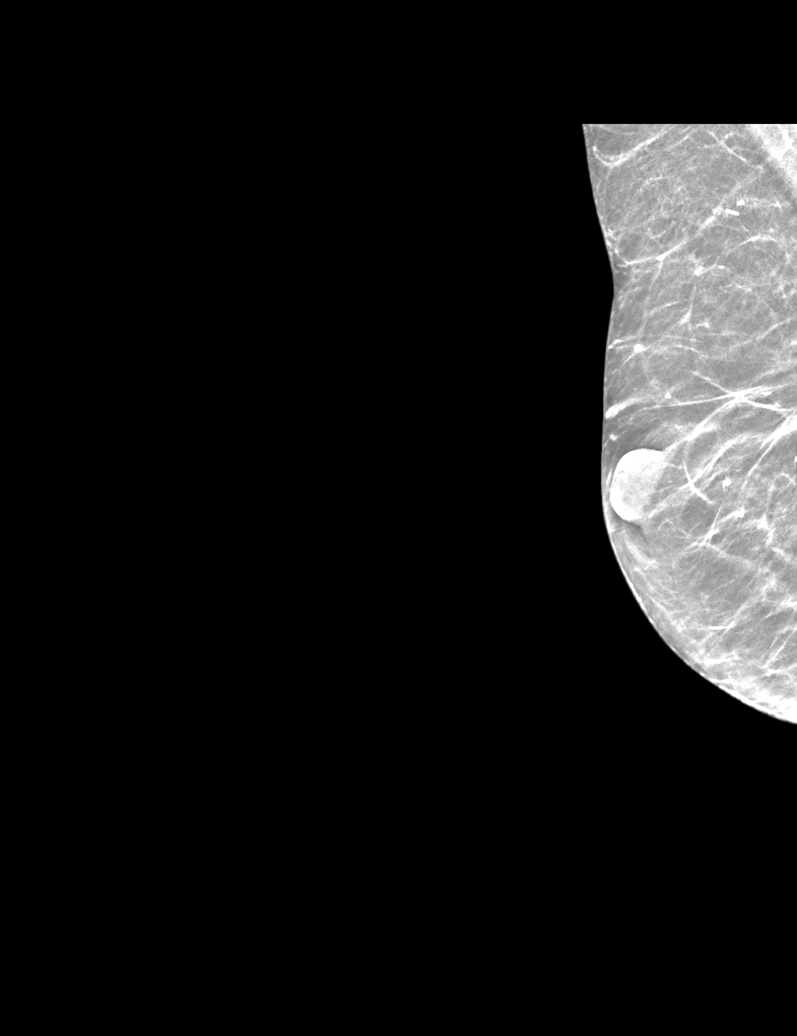

[R CC synth-2D]
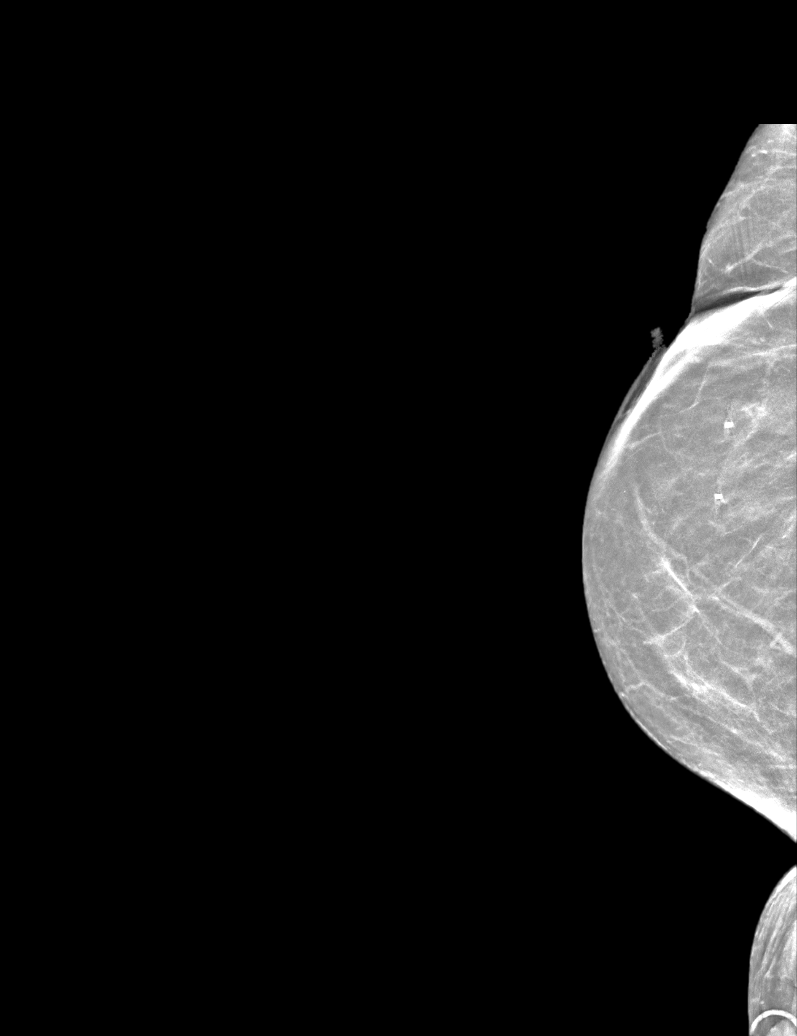

[R MLO synth-2D (2 of 2)]
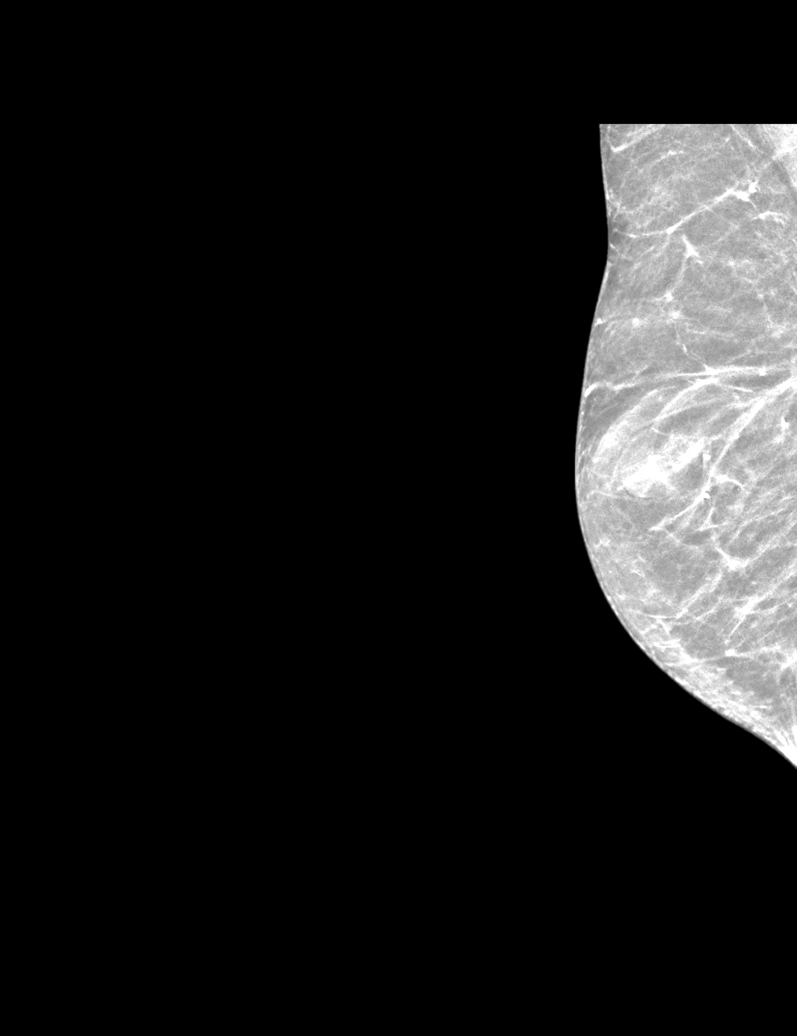

[L CC synth-2D]
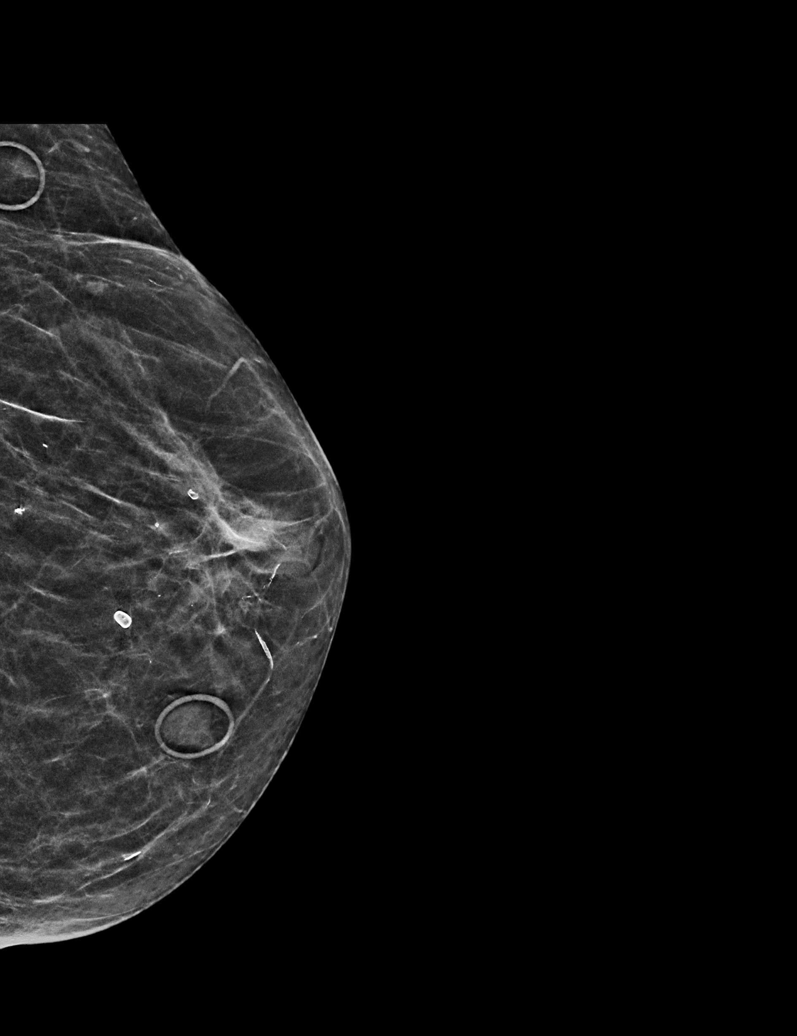

[R CC tomo · tomo slice 25/48.0]
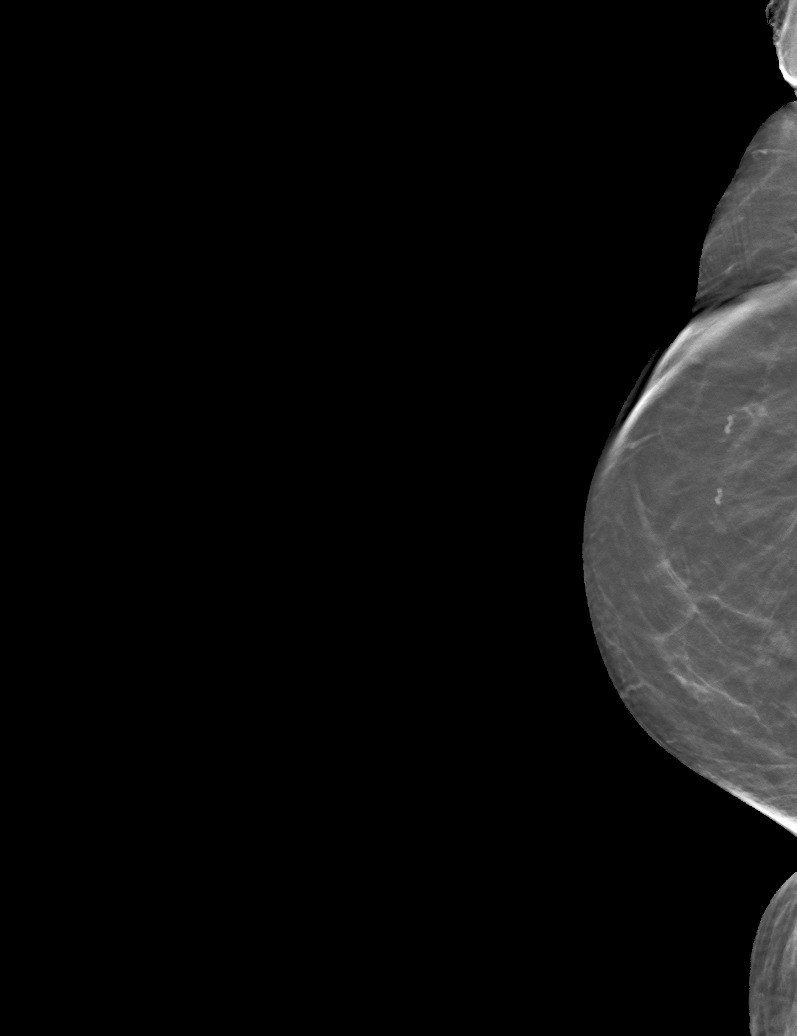

[6 of 30 positions shown; findings below may reference images not displayed]

ACR Breast Density Category b: There are scattered areas of
fibroglandular density.
FINDINGS: No suspicious masses or calcifications are seen in either breast.
Palpable area of concern corresponds with a skin lesion/mole in the
left axilla. There are no findings of malignancy in either breast.
IMPRESSION: No mammographic evidence of malignancy.

RECOMMENDATION:
Screening mammogram in one year.(Code:[id number])

I have discussed the findings and recommendations with the patient.
If applicable, a reminder letter will be sent to the patient
regarding the next appointment.

BI-RADS CATEGORY  2: Benign.

## 2022-08-01 DIAGNOSIS — R0902 Hypoxemia: Secondary | ICD-10-CM | POA: Diagnosis not present

## 2022-08-01 DIAGNOSIS — R600 Localized edema: Secondary | ICD-10-CM | POA: Diagnosis not present

## 2022-08-01 DIAGNOSIS — J449 Chronic obstructive pulmonary disease, unspecified: Secondary | ICD-10-CM | POA: Diagnosis not present

## 2022-08-28 DIAGNOSIS — H25811 Combined forms of age-related cataract, right eye: Secondary | ICD-10-CM | POA: Diagnosis not present

## 2022-08-28 DIAGNOSIS — H25812 Combined forms of age-related cataract, left eye: Secondary | ICD-10-CM | POA: Diagnosis not present

## 2022-08-30 DIAGNOSIS — E876 Hypokalemia: Secondary | ICD-10-CM | POA: Diagnosis not present

## 2022-08-30 DIAGNOSIS — K219 Gastro-esophageal reflux disease without esophagitis: Secondary | ICD-10-CM | POA: Diagnosis not present

## 2022-08-30 DIAGNOSIS — I5020 Unspecified systolic (congestive) heart failure: Secondary | ICD-10-CM | POA: Diagnosis not present

## 2022-08-30 DIAGNOSIS — J449 Chronic obstructive pulmonary disease, unspecified: Secondary | ICD-10-CM | POA: Diagnosis not present

## 2022-08-30 DIAGNOSIS — E871 Hypo-osmolality and hyponatremia: Secondary | ICD-10-CM | POA: Diagnosis not present

## 2022-08-30 DIAGNOSIS — Z853 Personal history of malignant neoplasm of breast: Secondary | ICD-10-CM | POA: Diagnosis not present

## 2022-08-30 DIAGNOSIS — Z13220 Encounter for screening for lipoid disorders: Secondary | ICD-10-CM | POA: Diagnosis not present

## 2022-08-30 DIAGNOSIS — N183 Chronic kidney disease, stage 3 unspecified: Secondary | ICD-10-CM | POA: Diagnosis not present

## 2022-08-30 DIAGNOSIS — Z1329 Encounter for screening for other suspected endocrine disorder: Secondary | ICD-10-CM | POA: Diagnosis not present

## 2022-08-30 DIAGNOSIS — Z131 Encounter for screening for diabetes mellitus: Secondary | ICD-10-CM | POA: Diagnosis not present

## 2022-08-30 DIAGNOSIS — I10 Essential (primary) hypertension: Secondary | ICD-10-CM | POA: Diagnosis not present

## 2022-09-01 DIAGNOSIS — R600 Localized edema: Secondary | ICD-10-CM | POA: Diagnosis not present

## 2022-09-01 DIAGNOSIS — J449 Chronic obstructive pulmonary disease, unspecified: Secondary | ICD-10-CM | POA: Diagnosis not present

## 2022-09-01 DIAGNOSIS — R0902 Hypoxemia: Secondary | ICD-10-CM | POA: Diagnosis not present

## 2022-09-05 DIAGNOSIS — E876 Hypokalemia: Secondary | ICD-10-CM | POA: Diagnosis not present

## 2022-09-05 DIAGNOSIS — R69 Illness, unspecified: Secondary | ICD-10-CM | POA: Diagnosis not present

## 2022-09-05 DIAGNOSIS — K219 Gastro-esophageal reflux disease without esophagitis: Secondary | ICD-10-CM | POA: Diagnosis not present

## 2022-09-05 DIAGNOSIS — H269 Unspecified cataract: Secondary | ICD-10-CM | POA: Diagnosis not present

## 2022-09-05 DIAGNOSIS — E871 Hypo-osmolality and hyponatremia: Secondary | ICD-10-CM | POA: Diagnosis not present

## 2022-09-05 DIAGNOSIS — I482 Chronic atrial fibrillation, unspecified: Secondary | ICD-10-CM | POA: Diagnosis not present

## 2022-09-05 DIAGNOSIS — I10 Essential (primary) hypertension: Secondary | ICD-10-CM | POA: Diagnosis not present

## 2022-09-05 DIAGNOSIS — H7392 Unspecified disorder of tympanic membrane, left ear: Secondary | ICD-10-CM | POA: Diagnosis not present

## 2022-09-05 DIAGNOSIS — J449 Chronic obstructive pulmonary disease, unspecified: Secondary | ICD-10-CM | POA: Diagnosis not present

## 2022-09-05 DIAGNOSIS — E039 Hypothyroidism, unspecified: Secondary | ICD-10-CM | POA: Diagnosis not present

## 2022-09-05 DIAGNOSIS — N1832 Chronic kidney disease, stage 3b: Secondary | ICD-10-CM | POA: Diagnosis not present

## 2022-09-05 DIAGNOSIS — I5020 Unspecified systolic (congestive) heart failure: Secondary | ICD-10-CM | POA: Diagnosis not present

## 2022-09-20 DIAGNOSIS — H25812 Combined forms of age-related cataract, left eye: Secondary | ICD-10-CM | POA: Diagnosis not present

## 2022-09-20 DIAGNOSIS — H269 Unspecified cataract: Secondary | ICD-10-CM | POA: Diagnosis not present

## 2022-09-28 DIAGNOSIS — I482 Chronic atrial fibrillation, unspecified: Secondary | ICD-10-CM | POA: Diagnosis not present

## 2022-09-28 DIAGNOSIS — R059 Cough, unspecified: Secondary | ICD-10-CM | POA: Diagnosis not present

## 2022-09-28 DIAGNOSIS — J029 Acute pharyngitis, unspecified: Secondary | ICD-10-CM | POA: Diagnosis not present

## 2022-09-28 DIAGNOSIS — N1832 Chronic kidney disease, stage 3b: Secondary | ICD-10-CM | POA: Diagnosis not present

## 2022-09-28 DIAGNOSIS — Z20828 Contact with and (suspected) exposure to other viral communicable diseases: Secondary | ICD-10-CM | POA: Diagnosis not present

## 2022-09-28 DIAGNOSIS — R030 Elevated blood-pressure reading, without diagnosis of hypertension: Secondary | ICD-10-CM | POA: Diagnosis not present

## 2022-09-28 DIAGNOSIS — J441 Chronic obstructive pulmonary disease with (acute) exacerbation: Secondary | ICD-10-CM | POA: Diagnosis not present

## 2022-09-28 DIAGNOSIS — I5020 Unspecified systolic (congestive) heart failure: Secondary | ICD-10-CM | POA: Diagnosis not present

## 2022-10-01 DIAGNOSIS — J449 Chronic obstructive pulmonary disease, unspecified: Secondary | ICD-10-CM | POA: Diagnosis not present

## 2022-10-01 DIAGNOSIS — R600 Localized edema: Secondary | ICD-10-CM | POA: Diagnosis not present

## 2022-10-01 DIAGNOSIS — R0902 Hypoxemia: Secondary | ICD-10-CM | POA: Diagnosis not present

## 2022-10-25 DIAGNOSIS — H269 Unspecified cataract: Secondary | ICD-10-CM | POA: Diagnosis not present

## 2022-10-25 DIAGNOSIS — H25811 Combined forms of age-related cataract, right eye: Secondary | ICD-10-CM | POA: Diagnosis not present

## 2022-11-01 DIAGNOSIS — N183 Chronic kidney disease, stage 3 unspecified: Secondary | ICD-10-CM | POA: Diagnosis not present

## 2022-11-01 DIAGNOSIS — E559 Vitamin D deficiency, unspecified: Secondary | ICD-10-CM | POA: Diagnosis not present

## 2022-11-01 DIAGNOSIS — E039 Hypothyroidism, unspecified: Secondary | ICD-10-CM | POA: Diagnosis not present

## 2022-11-01 DIAGNOSIS — E876 Hypokalemia: Secondary | ICD-10-CM | POA: Diagnosis not present

## 2022-11-01 DIAGNOSIS — R600 Localized edema: Secondary | ICD-10-CM | POA: Diagnosis not present

## 2022-11-01 DIAGNOSIS — Z13220 Encounter for screening for lipoid disorders: Secondary | ICD-10-CM | POA: Diagnosis not present

## 2022-11-01 DIAGNOSIS — R0902 Hypoxemia: Secondary | ICD-10-CM | POA: Diagnosis not present

## 2022-11-01 DIAGNOSIS — J449 Chronic obstructive pulmonary disease, unspecified: Secondary | ICD-10-CM | POA: Diagnosis not present

## 2022-11-06 DIAGNOSIS — I482 Chronic atrial fibrillation, unspecified: Secondary | ICD-10-CM | POA: Diagnosis not present

## 2022-11-06 DIAGNOSIS — I10 Essential (primary) hypertension: Secondary | ICD-10-CM | POA: Diagnosis not present

## 2022-11-06 DIAGNOSIS — J441 Chronic obstructive pulmonary disease with (acute) exacerbation: Secondary | ICD-10-CM | POA: Diagnosis not present

## 2022-11-06 DIAGNOSIS — F419 Anxiety disorder, unspecified: Secondary | ICD-10-CM | POA: Diagnosis not present

## 2022-11-06 DIAGNOSIS — N1831 Chronic kidney disease, stage 3a: Secondary | ICD-10-CM | POA: Diagnosis not present

## 2022-11-06 DIAGNOSIS — R69 Illness, unspecified: Secondary | ICD-10-CM | POA: Diagnosis not present

## 2022-11-06 DIAGNOSIS — I5020 Unspecified systolic (congestive) heart failure: Secondary | ICD-10-CM | POA: Diagnosis not present

## 2022-11-06 DIAGNOSIS — Z6823 Body mass index (BMI) 23.0-23.9, adult: Secondary | ICD-10-CM | POA: Diagnosis not present

## 2022-11-06 DIAGNOSIS — F339 Major depressive disorder, recurrent, unspecified: Secondary | ICD-10-CM | POA: Diagnosis not present

## 2022-11-06 DIAGNOSIS — E039 Hypothyroidism, unspecified: Secondary | ICD-10-CM | POA: Diagnosis not present

## 2022-11-09 ENCOUNTER — Ambulatory Visit: Payer: Medicare HMO | Admitting: Pulmonary Disease

## 2022-11-09 ENCOUNTER — Encounter: Payer: Self-pay | Admitting: Pulmonary Disease

## 2022-11-09 VITALS — BP 140/90 | HR 64 | Ht 61.0 in | Wt 120.4 lb

## 2022-11-09 DIAGNOSIS — R911 Solitary pulmonary nodule: Secondary | ICD-10-CM

## 2022-11-09 DIAGNOSIS — J9611 Chronic respiratory failure with hypoxia: Secondary | ICD-10-CM | POA: Diagnosis not present

## 2022-11-09 DIAGNOSIS — I2729 Other secondary pulmonary hypertension: Secondary | ICD-10-CM | POA: Insufficient documentation

## 2022-11-09 DIAGNOSIS — J4489 Other specified chronic obstructive pulmonary disease: Secondary | ICD-10-CM

## 2022-11-09 DIAGNOSIS — J439 Emphysema, unspecified: Secondary | ICD-10-CM | POA: Diagnosis not present

## 2022-11-09 NOTE — Assessment & Plan Note (Signed)
Daughter on CT chest 02/2018, reportedly stable and she refused biopsy in the past. We will obtain follow-up CT chest without contrast

## 2022-11-09 NOTE — Patient Instructions (Addendum)
  X schedule PFTs  X Amb sat   X CT chest wo con to follow up on nodule

## 2022-11-09 NOTE — Assessment & Plan Note (Signed)
She desaturates on walking and improves with 3 L oxygen. She will qualify for portable concentrator

## 2022-11-09 NOTE — Progress Notes (Signed)
Subjective:    Patient ID: Stacy Johnson, female    DOB: 10/04/1946, 77 y.o.   MRN: [id number]  HPI  Chief Complaint  Patient presents with   Follow-up    New pt visit seen MW previously in 2022, she is currently experiencing SOB/wheezing. She states she used 3L O2 at night and none during the day. Is currently unsure of all the medications she is taking    77 year old remote smoker presents to establish care for COPD, chronic hypoxic respiratory failure and lung nodules. She smoked more than 40 pack years up to 2 packs/day before she quit in 2004.  She was diagnosed with COPD in [place], [place] by pulmonologist Dr. Concha [place] . She reports pulmonary nodules that were stable on follow-up, she refused biopsy She had episode of severe pneumonia in 2022 and was discharged on oxygen and took her a few months to recover.  She was evaluated by my partner in 02/2021 and was noted to be in atrial fibrillation RVR.  I reviewed her pCP notes, for some reason she does not want to go to cardiology.  She has been maintained on amiodarone.  She refuses to start blood thinner She is accompanied by her partner Jacqlyn Krauss  On ambulation saturation dropped from 90% to 84% and required 3 L of oxygen to recover and maintain while walking. She uses 3 L of oxygen during sleep and reports compliance with this but rarely uses oxygen in the daytime She reports compliance with Trelegy, she has tried Librarian, academic in the past. She reports increased shortness of breath for 2 days given by her PCP, she had eye surgery in December  Labs obtained from PCP were reviewed which were normal with a BUN/creatinine of 11/1.1 and an elevated TSH of 18 I reviewed CT chest that she brought on a CD-ROM without attached report from 02/2018   PMH -  HFrEF Atrial fibrillation RVR, stopped Eliquis does not want Coumadin, does not want cards referral Right breast cancer s/p lumpectomy and RT 2005 CKD stage  III  Significant tests/ events reviewed  02/2018 CT chest reviewed, severe apical bullous emphysema, peripheral left upper lobe pulmonary nodule  02/15/2021 Walked 2lpm approx 100 ft @ moderate pace stopped due to pulse 170 No sob with sats still 98% Vs sats 86% RA (clinically 02 dep since 11/2020   Echo 11/5364 grade 2 diastolic dysfunction, EF 55 to 60%, RVSP 58 , moderate TR, moderate to severe pulm hypertension  Past Medical History:  Diagnosis Date   Anxiety disorder    Body mass index 26.0-26.9, adult    Chronic obstructive pulmonary disease (COPD) ([place])    Gangrene ([place])    Hypertension    Localized edema      Past Surgical History:  Procedure Laterality Date   ABDOMINAL HYSTERECTOMY     COLONOSCOPY     less than 10 years    No Known Allergies  Social History   Socioeconomic History   Marital status: Widowed    Spouse name: Not on file   Number of children: Not on file   Years of education: Not on file   Highest education level: Not on file  Occupational History   Not on file  Tobacco Use   Smoking status: Former    Packs/day: 1.00    Years: 46.00    Total pack years: 46.00    Types: Cigarettes    Quit date: 10/16/2007    Years since quitting: 32.0  Smokeless tobacco: Never  Substance and Sexual Activity   Alcohol use: Not Currently   Drug use: Never   Sexual activity: Not on file  Other Topics Concern   Not on file  Social History Narrative   Not on file   Social Determinants of Health   Financial Resource Strain: Not on file  Food Insecurity: Not on file  Transportation Needs: Not on file  Physical Activity: Not on file  Stress: Not on file  Social Connections: Not on file  Intimate Partner Violence: Not on file   Family history -breast cancer in her mother Review of Systems Constitutional: negative for anorexia, fevers and sweats  Eyes: negative for irritation, redness and visual disturbance  Ears, nose, mouth, throat, and face: negative for  earaches, epistaxis, nasal congestion and sore throat  Respiratory: negative for cough, sputum and wheezing  Cardiovascular: negative for chest pain, lower extremity edema, orthopnea, palpitations and syncope  Gastrointestinal: negative for abdominal pain, constipation, diarrhea, melena, nausea and vomiting  Genitourinary:negative for dysuria, frequency and hematuria  Hematologic/lymphatic: negative for bleeding, easy bruising and lymphadenopathy  Musculoskeletal:negative for arthralgias, muscle weakness and stiff joints  Neurological: negative for coordination problems, gait problems, headaches and weakness  Endocrine: negative for diabetic symptoms including polydipsia, polyuria and weight loss     Objective:   Physical Exam  Gen. Pleasant,  in no distress, normal affect ENT - no pallor,icterus, no post nasal drip, class 2-3 airway Neck: No JVD, no thyromegaly, no carotid bruits Lungs: no use of accessory muscles, no dullness to percussion, decreased without rales or rhonchi  Cardiovascular: Rhythm regular, heart sounds  normal, no murmurs or gallops, no peripheral edema Abdomen: soft and non-tender, no hepatosplenomegaly, BS normal. Musculoskeletal: No deformities, no cyanosis or clubbing Neuro:  alert, non focal, no tremors       Assessment & Plan:

## 2022-11-09 NOTE — Assessment & Plan Note (Signed)
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 I encouraged her to see a cardiologist for her atrial fibrillation and known heart failure problems but she is resistant.  Will obtain an echocardiogram and see if we can convince her based on results

## 2022-11-09 NOTE — Assessment & Plan Note (Signed)
Continue Trelegy, use albuterol for rescue. She has been given prednisone by her PCP and she will complete course. Will obtain PFTs and referral to pulmonary rehab in the future

## 2022-11-12 ENCOUNTER — Telehealth: Payer: Self-pay | Admitting: *Deleted

## 2022-11-12 ENCOUNTER — Other Ambulatory Visit: Payer: Self-pay

## 2022-11-12 ENCOUNTER — Ambulatory Visit
Admission: RE | Admit: 2022-11-12 | Discharge: 2022-11-12 | Disposition: A | Discharge status: Home / Self Care | Payer: Self-pay | Source: Ambulatory Visit | Attending: Pulmonary Disease | Admitting: Pulmonary Disease

## 2022-11-12 DIAGNOSIS — J9611 Chronic respiratory failure with hypoxia: Secondary | ICD-10-CM

## 2022-11-12 DIAGNOSIS — R911 Solitary pulmonary nodule: Secondary | ICD-10-CM

## 2022-11-12 NOTE — Progress Notes (Signed)
Order placed and printed for Stacy Johnson. Placed in order comments and on canopy form for CD to be returned to patient.

## 2022-11-12 NOTE — Telephone Encounter (Signed)
Called and spoke with patient. She is wanting lighter POC she discussed with Dr.Alva on Friday at ov. She states she has the smallest POC they offer and needs a lighter one since this one is too heavy.  Patient states her DME is [facility]?  Will send order in for inogen machine.   Also notified patient that CT discs were sent in to [facility] and we or them would call her once they are read to be picked up.

## 2022-11-21 ENCOUNTER — Ambulatory Visit (HOSPITAL_COMMUNITY)
Admission: RE | Admit: 2022-11-21 | Discharge: 2022-11-21 | Disposition: A | Discharge status: Home / Self Care | Payer: Medicare HMO | Source: Ambulatory Visit | Attending: Pulmonary Disease | Admitting: Pulmonary Disease

## 2022-11-21 DIAGNOSIS — J9611 Chronic respiratory failure with hypoxia: Secondary | ICD-10-CM | POA: Diagnosis not present

## 2022-11-21 DIAGNOSIS — J432 Centrilobular emphysema: Secondary | ICD-10-CM | POA: Diagnosis not present

## 2022-11-21 DIAGNOSIS — R918 Other nonspecific abnormal finding of lung field: Secondary | ICD-10-CM | POA: Diagnosis not present

## 2022-11-22 ENCOUNTER — Telehealth: Payer: Self-pay | Admitting: Pulmonary Disease

## 2022-11-22 DIAGNOSIS — J9611 Chronic respiratory failure with hypoxia: Secondary | ICD-10-CM

## 2022-11-23 NOTE — Telephone Encounter (Signed)
Spk to patient states her poc is heavy and needs a smaller one. States she has already spoken to [facility] and they say it's the smallest. States she would like an inogen

## 2022-12-02 DIAGNOSIS — R0902 Hypoxemia: Secondary | ICD-10-CM | POA: Diagnosis not present

## 2022-12-02 DIAGNOSIS — J449 Chronic obstructive pulmonary disease, unspecified: Secondary | ICD-10-CM | POA: Diagnosis not present

## 2022-12-02 DIAGNOSIS — R600 Localized edema: Secondary | ICD-10-CM | POA: Diagnosis not present

## 2022-12-03 ENCOUNTER — Telehealth: Payer: Self-pay

## 2022-12-03 NOTE — Telephone Encounter (Signed)
Patient called and states she has not heard from [employer] regarding her Inogen machine. Called an [employer] location and asked if they carry inogens but she states she did not ask if they had her order. She has questions about the inogen machine and getting it from [employer]. Advised her I would send a community message to [employer] and have them contact her.   Vickii Penna, [place]; Alonna Minium; Minus Liberty; Nash Shearer Received, Thank you!       Previous Messages    ----- Message ----- From: Sharlot Gowda, CMA Sent: 12/03/2022   2:35 PM EST To: Darlina Guys; Miquel Dunn; Nash Shearer; * Subject: Inogen machine                                Hello!  This patient called today and states she hasn't heard from [employer] about getting her Inogen machine. She would like to speak with a rep from [employer] to talk about the Inogen machine. Can one of you give her a call at [phone]?  Thank you! Shelanda Duvall    Nothing further needed at this time

## 2022-12-10 DIAGNOSIS — J9601 Acute respiratory failure with hypoxia: Secondary | ICD-10-CM | POA: Diagnosis not present

## 2022-12-19 ENCOUNTER — Ambulatory Visit (HOSPITAL_COMMUNITY)
Admission: RE | Admit: 2022-12-19 | Discharge: 2022-12-19 | Disposition: A | Discharge status: Home / Self Care | Payer: Medicare HMO | Source: Ambulatory Visit | Attending: Pulmonary Disease | Admitting: Pulmonary Disease

## 2022-12-19 DIAGNOSIS — I2729 Other secondary pulmonary hypertension: Secondary | ICD-10-CM

## 2022-12-19 LAB — ECHOCARDIOGRAM COMPLETE
AR max vel: 1.41 cm2
AV Area VTI: 1.6 cm2
AV Area mean vel: 1.68 cm2
AV Mean grad: 4 mmHg
AV Peak grad: 8.1 mmHg
Ao pk vel: 1.42 m/s
Area-P 1/2: 5.54 cm2
Calc EF: 42.4 %
MV M vel: 5.58 m/s
MV Peak grad: 124.5 mmHg
S' Lateral: 3.6 cm
Single Plane A2C EF: 39.2 %
Single Plane A4C EF: 47.3 %

## 2022-12-19 NOTE — Telephone Encounter (Signed)
See encounter from 02/19. Will close encounter.

## 2022-12-19 NOTE — Progress Notes (Signed)
*  PRELIMINARY RESULTS* Echocardiogram 2D Echocardiogram has been performed.  Stacy Johnson 12/19/2022, 11:21 AM

## 2022-12-24 ENCOUNTER — Other Ambulatory Visit: Payer: Self-pay

## 2022-12-24 DIAGNOSIS — R931 Abnormal findings on diagnostic imaging of heart and coronary circulation: Secondary | ICD-10-CM

## 2022-12-31 DIAGNOSIS — R600 Localized edema: Secondary | ICD-10-CM | POA: Diagnosis not present

## 2022-12-31 DIAGNOSIS — R0902 Hypoxemia: Secondary | ICD-10-CM | POA: Diagnosis not present

## 2022-12-31 DIAGNOSIS — J449 Chronic obstructive pulmonary disease, unspecified: Secondary | ICD-10-CM | POA: Diagnosis not present

## 2023-01-02 DIAGNOSIS — M62830 Muscle spasm of back: Secondary | ICD-10-CM | POA: Diagnosis not present

## 2023-01-02 DIAGNOSIS — Z6824 Body mass index (BMI) 24.0-24.9, adult: Secondary | ICD-10-CM | POA: Diagnosis not present

## 2023-01-02 DIAGNOSIS — I10 Essential (primary) hypertension: Secondary | ICD-10-CM | POA: Diagnosis not present

## 2023-01-08 DIAGNOSIS — J9601 Acute respiratory failure with hypoxia: Secondary | ICD-10-CM | POA: Diagnosis not present

## 2023-01-10 DIAGNOSIS — N1832 Chronic kidney disease, stage 3b: Secondary | ICD-10-CM | POA: Diagnosis not present

## 2023-01-10 DIAGNOSIS — E876 Hypokalemia: Secondary | ICD-10-CM | POA: Diagnosis not present

## 2023-01-10 DIAGNOSIS — E039 Hypothyroidism, unspecified: Secondary | ICD-10-CM | POA: Diagnosis not present

## 2023-01-10 DIAGNOSIS — E871 Hypo-osmolality and hyponatremia: Secondary | ICD-10-CM | POA: Diagnosis not present

## 2023-01-17 ENCOUNTER — Ambulatory Visit: Payer: Medicare HMO | Admitting: Pulmonary Disease

## 2023-01-17 ENCOUNTER — Encounter: Payer: Self-pay | Admitting: Pulmonary Disease

## 2023-01-17 VITALS — BP 128/82 | HR 72 | Wt 120.0 lb

## 2023-01-17 DIAGNOSIS — I2729 Other secondary pulmonary hypertension: Secondary | ICD-10-CM

## 2023-01-17 DIAGNOSIS — J9611 Chronic respiratory failure with hypoxia: Secondary | ICD-10-CM

## 2023-01-17 DIAGNOSIS — I5022 Chronic systolic (congestive) heart failure: Secondary | ICD-10-CM | POA: Diagnosis not present

## 2023-01-17 DIAGNOSIS — R69 Illness, unspecified: Secondary | ICD-10-CM | POA: Diagnosis not present

## 2023-01-17 DIAGNOSIS — I5020 Unspecified systolic (congestive) heart failure: Secondary | ICD-10-CM | POA: Diagnosis not present

## 2023-01-17 DIAGNOSIS — J439 Emphysema, unspecified: Secondary | ICD-10-CM | POA: Diagnosis not present

## 2023-01-17 DIAGNOSIS — I10 Essential (primary) hypertension: Secondary | ICD-10-CM | POA: Diagnosis not present

## 2023-01-17 DIAGNOSIS — I482 Chronic atrial fibrillation, unspecified: Secondary | ICD-10-CM | POA: Diagnosis not present

## 2023-01-17 DIAGNOSIS — N1831 Chronic kidney disease, stage 3a: Secondary | ICD-10-CM | POA: Diagnosis not present

## 2023-01-17 DIAGNOSIS — E039 Hypothyroidism, unspecified: Secondary | ICD-10-CM | POA: Diagnosis not present

## 2023-01-17 DIAGNOSIS — E871 Hypo-osmolality and hyponatremia: Secondary | ICD-10-CM | POA: Diagnosis not present

## 2023-01-17 DIAGNOSIS — R590 Localized enlarged lymph nodes: Secondary | ICD-10-CM | POA: Diagnosis not present

## 2023-01-17 DIAGNOSIS — R911 Solitary pulmonary nodule: Secondary | ICD-10-CM | POA: Diagnosis not present

## 2023-01-17 DIAGNOSIS — J4489 Other specified chronic obstructive pulmonary disease: Secondary | ICD-10-CM | POA: Diagnosis not present

## 2023-01-17 DIAGNOSIS — J449 Chronic obstructive pulmonary disease, unspecified: Secondary | ICD-10-CM | POA: Diagnosis not present

## 2023-01-17 DIAGNOSIS — I700 Atherosclerosis of aorta: Secondary | ICD-10-CM | POA: Diagnosis not present

## 2023-01-17 NOTE — Progress Notes (Signed)
   Subjective:    Patient ID: Stacy Johnson, female    DOB: [birth year], 77 y.o.   MRN: [id number]  HPI  77 yo remote smoker for FU of COPD, chronic hypoxic respiratory failure and lung nodules. She has moderate to severe pulmonary hypertension,likely WHO 2/3   She smoked more than 40 pack years up to 2 packs/day before she quit in 2004.  She was diagnosed with COPD in [facility], [place] by pulmonologist Dr. Lynnae Prude . She reports pulmonary nodules that were stable on follow-up, she refused biopsy   PMH -  HFrEF Atrial fibrillation RVR, stopped Eliquis does not want Coumadin, does not want cards referral Right breast cancer s/p lumpectomy and RT 2005 CKD stage III  110-month follow-up visit We were able to provide her with an Inogen POC after her initial visit 10/2022.  She still arrives without oxygen today Saturation is 87% on room air.  We reviewed CT scan and echo results.  PFTs were not performed She is compliant with Trelegy.  She states that breathing is stable.  She uses albuterol as needed for rescue   Significant tests/ events reviewed   11/09/22 On ambulation saturation dropped from 90% to 84% and required 3 L of oxygen to recover and maintain while walking.   11/2022 CT chest without con>> left upper lobe subpleural nodule slightly increased in size to 11 x 8 mm, previously 9 x 6 mm, new left lower lobe nodule 5 mm, new groundglass opacity right lower lobe with right upper paratracheal lymph node subcentimeter  02/2018 CT chest >> severe apical bullous emphysema, peripheral left upper lobe pulmonary nodule   02/15/2021 Walked 2lpm approx 100 ft @ moderate pace stopped due to pulse 170 No sob with sats still 98% Vs sats 86% RA (clinically 02 dep since 11/2020    Echo 12/2022 EF 45 to 50%, global hypokinesis, RVSP 38  Echo 12/2021 grade 2 diastolic dysfunction, EF 55 to 60%, RVSP 58 , moderate TR, moderate to severe pulm hypertension    Review of Systems neg  for any significant sore throat, dysphagia, itching, sneezing, nasal congestion or excess/ purulent secretions, fever, chills, sweats, unintended wt loss, pleuritic or exertional cp, hempoptysis, orthopnea pnd or change in chronic leg swelling. Also denies presyncope, palpitations, heartburn, abdominal pain, nausea, vomiting, diarrhea or change in bowel or urinary habits, dysuria,hematuria, rash, arthralgias, visual complaints, headache, numbness weakness or ataxia.     Objective:   Physical Exam  Gen. Pleasant,petite woman, in no distress ENT - no lesions, no post nasal drip Neck: No JVD, no thyromegaly, no carotid bruits Lungs: no use of accessory muscles, no dullness to percussion, decreased without rales or rhonchi  Cardiovascular: Rhythm regular, heart sounds  normal, no murmurs or gallops, no peripheral edema Musculoskeletal: No deformities, no cyanosis or clubbing , no tremors       Assessment & Plan:

## 2023-01-17 NOTE — Patient Instructions (Addendum)
  X schedule pFTs @ [facility]  X CT chest w con in 20months  Obtain records from dr bostick in [facility]  Use oxygen when walking & during sleep

## 2023-01-18 DIAGNOSIS — I5022 Chronic systolic (congestive) heart failure: Secondary | ICD-10-CM | POA: Insufficient documentation

## 2023-01-18 NOTE — Assessment & Plan Note (Signed)
Left upper lobe subpleural nodule is marginally increased over 5 years this may be a benign carcinoid or very slow-growing malignancy and will need surveillance.  There is a new nodule in the left lower lobe which will be followed up with a CT chest in 4 months.  There is paratracheal lymphadenopathy with groundglass in the right lower lobe which is likely inflammatory and follow-up CT scan can be obtained with contrast to better examine mediastinum

## 2023-01-18 NOTE — Assessment & Plan Note (Addendum)
Continue Trelegy. Use albuterol for rescue. She will qualify for pulmonary rehab program which I offered her but she is not interested We discussed COPD action plan and signs and symptoms of COPD exacerbation We will arrange for PFTs

## 2023-01-18 NOTE — Assessment & Plan Note (Signed)
WHO 2/3.  Surprisingly pulmonary pressure appears decreased compared to 2023 even though LV function appears worse

## 2023-01-18 NOTE — Assessment & Plan Note (Signed)
Emphasized use of oxygen 24/7

## 2023-01-18 NOTE — Assessment & Plan Note (Signed)
EF appears mildly decreased compared to 2023.  This may be the result of chronic atrial fibrillation.  She is finally agreeable to seeing cardiology and has an appointment within a few weeks

## 2023-01-29 ENCOUNTER — Ambulatory Visit: Payer: Medicare HMO | Admitting: Pulmonary Disease

## 2023-01-31 DIAGNOSIS — J449 Chronic obstructive pulmonary disease, unspecified: Secondary | ICD-10-CM | POA: Diagnosis not present

## 2023-01-31 DIAGNOSIS — E871 Hypo-osmolality and hyponatremia: Secondary | ICD-10-CM | POA: Diagnosis not present

## 2023-01-31 DIAGNOSIS — N183 Chronic kidney disease, stage 3 unspecified: Secondary | ICD-10-CM | POA: Diagnosis not present

## 2023-01-31 DIAGNOSIS — R600 Localized edema: Secondary | ICD-10-CM | POA: Diagnosis not present

## 2023-01-31 DIAGNOSIS — R0902 Hypoxemia: Secondary | ICD-10-CM | POA: Diagnosis not present

## 2023-02-08 DIAGNOSIS — J9601 Acute respiratory failure with hypoxia: Secondary | ICD-10-CM | POA: Diagnosis not present

## 2023-02-13 ENCOUNTER — Ambulatory Visit: Payer: Medicare HMO | Attending: Internal Medicine | Admitting: Internal Medicine

## 2023-02-13 ENCOUNTER — Other Ambulatory Visit (HOSPITAL_COMMUNITY)
Admission: RE | Admit: 2023-02-13 | Discharge: 2023-02-13 | Disposition: A | Discharge status: Home / Self Care | Payer: Medicare HMO | Source: Ambulatory Visit | Attending: Internal Medicine | Admitting: Internal Medicine

## 2023-02-13 ENCOUNTER — Encounter: Payer: Self-pay | Admitting: Internal Medicine

## 2023-02-13 VITALS — BP 140/90 | HR 57 | Ht 59.0 in | Wt 124.6 lb

## 2023-02-13 DIAGNOSIS — Z79899 Other long term (current) drug therapy: Secondary | ICD-10-CM | POA: Insufficient documentation

## 2023-02-13 DIAGNOSIS — Z131 Encounter for screening for diabetes mellitus: Secondary | ICD-10-CM | POA: Insufficient documentation

## 2023-02-13 DIAGNOSIS — I5022 Chronic systolic (congestive) heart failure: Secondary | ICD-10-CM | POA: Insufficient documentation

## 2023-02-13 DIAGNOSIS — I4891 Unspecified atrial fibrillation: Secondary | ICD-10-CM

## 2023-02-13 DIAGNOSIS — I480 Paroxysmal atrial fibrillation: Secondary | ICD-10-CM | POA: Insufficient documentation

## 2023-02-13 DIAGNOSIS — R000 Tachycardia, unspecified: Secondary | ICD-10-CM | POA: Diagnosis not present

## 2023-02-13 DIAGNOSIS — I739 Peripheral vascular disease, unspecified: Secondary | ICD-10-CM | POA: Insufficient documentation

## 2023-02-13 DIAGNOSIS — I43 Cardiomyopathy in diseases classified elsewhere: Secondary | ICD-10-CM | POA: Diagnosis not present

## 2023-02-13 LAB — TSH: TSH: 6.989 u[IU]/mL — ABNORMAL HIGH (ref 0.350–4.500)

## 2023-02-13 LAB — T4, FREE: Free T4: 1.02 ng/dL (ref 0.61–1.12)

## 2023-02-13 LAB — HEMOGLOBIN A1C
Hgb A1c MFr Bld: 4.8 % (ref 4.8–5.6)
Mean Plasma Glucose: 91.06 mg/dL

## 2023-02-13 MED ORDER — LOSARTAN POTASSIUM 25 MG PO TABS: 25.0000 mg | ORAL_TABLET | Freq: Every day | ORAL | 3 refills | Status: DC

## 2023-02-13 MED ORDER — BISOPROLOL FUMARATE 5 MG PO TABS: 5.0000 mg | ORAL_TABLET | Freq: Every day | ORAL | 3 refills | Status: DC

## 2023-02-13 NOTE — Progress Notes (Addendum)
Cardiology Office Note  Date: 02/13/2023   ID: Stacy Johnson, DOB March 22, 1946, MRN [id number]  PCP:  Juliette Alcide, MD  Cardiologist:  Marjo Bicker, MD Electrophysiologist:  None   Reason for Office Visit: Evaluation of mildly reduced systolic function at the request of Dr. Vassie Loll   History of Present Illness: Stacy Johnson is a 77 y.o. female known to have tachycardia induced cardiomyopathy with LVEF 20% improved to 55% in 2023, paroxysmal atrial fibrillation, HTN, PAD, anxiety was referred to cardiology clinic for new onset cardiomyopathy LVEF 45 to 50% on 12/2022 echocardiogram.  Patient was admitted to [facility] 06/03/2021 with A-fib with RVR and new onset cardiomyopathy LVEF 20%. After A-fib rates are controlled, she was placed on amiodarone and discharged. Repeat echocardiogram in 2023 showed normal LVEF.  Recently, she started to have severe fatigue and cold intolerance symptoms for which she was diagnosed newly with hypothyroidism and started on Synthroid.  She was also discharged on Eliquis which was switched to Coumadin due to cost issues.  She self discontinued Coumadin as she was tired of getting INR checked frequently.  She is here for new patient visit.  She is restricting her daily activities due to fear of SOB.  Hence, no symptoms of DOE were reproduced.  No angina, palpitations, leg swelling.  No syncope.  Patient underwent ABI in 2021 that showed R resting ABI 0.67 and postexercise ABI 0.39, L resting ABI 0.58 and postexercise ABI 0.43.  She was seen by Dr. Arbie Cookey in 2021, did not have any claudication symptoms and was told to return back as needed basis for any progression of claudication symptoms.  Past Medical History:  Diagnosis Date   Anxiety disorder    Body mass index 26.0-26.9, adult    Chronic obstructive pulmonary disease (COPD) (HCC)    Gangrene (HCC)    Hypertension    Localized edema     Past Surgical History:  Procedure Laterality Date   ABDOMINAL  HYSTERECTOMY     COLONOSCOPY     less than 10 years    Current Outpatient Medications  Medication Sig Dispense Refill   albuterol (ACCUNEB) 1.25 MG/3ML nebulizer solution Take 1 ampule by nebulization every 6 (six) hours as needed for wheezing.     ALBUTEROL IN Inhale 2 puffs into the lungs every 4 (four) hours as needed.     bisoprolol (ZEBETA) 5 MG tablet Take 1 tablet (5 mg total) by mouth daily. 90 tablet 3   furosemide (LASIX) 40 MG tablet Take 40 mg by mouth as needed.     indapamide (LOZOL) 1.25 MG tablet Take 1.25 mg by mouth daily.     levothyroxine (SYNTHROID) 50 MCG tablet Take 50 mcg by mouth daily.     losartan (COZAAR) 25 MG tablet Take 1 tablet (25 mg total) by mouth daily. 90 tablet 3   ofloxacin (OCUFLOX) 0.3 % ophthalmic solution Place 1 drop into the right eye every 4 (four) hours.     potassium chloride SA (KLOR-CON M) 20 MEQ tablet Take 20 mEq by mouth 2 (two) times daily.     TRELEGY ELLIPTA 100-62.5-25 MCG/ACT AEPB Take 1 puff by mouth daily.     apixaban (ELIQUIS) 5 MG TABS tablet Take 1 tablet by mouth 2 (two) times daily. (Patient not taking: Reported on 02/13/2023)     escitalopram (LEXAPRO) 10 MG tablet Take 10 mg by mouth daily. (Patient not taking: Reported on 02/13/2023)     ETHACRYNIC ACID PO Take 25  mg by mouth daily. (Patient not taking: Reported on 02/13/2023)     FLUoxetine (PROZAC) 10 MG tablet Take 10 mg by mouth daily. (Patient not taking: Reported on 02/13/2023)     prednisoLONE acetate (PRED FORTE) 1 % ophthalmic suspension Place 1 drop into both eyes every 4 (four) hours. (Patient not taking: Reported on 02/13/2023)     predniSONE (STERAPRED UNI-PAK 21 TAB) 10 MG (21) TBPK tablet Take 10 mg by mouth as directed. (Patient not taking: Reported on 02/13/2023)     No current facility-administered medications for this visit.   Allergies:  Patient has no known allergies.   Social History: The patient  reports that she quit smoking about 15 years ago. Her smoking  use included cigarettes. She has a 46.00 pack-year smoking history. She has never used smokeless tobacco. She reports that she does not currently use alcohol. She reports that she does not use drugs.   Family History: The patient's family history is not on file.   ROS:  Please see the history of present illness. Otherwise, complete review of systems is positive for none.  All other systems are reviewed and negative.   Physical Exam: VS:  BP (!) 140/90   Pulse (!) 57   Ht 4\' 11"  (1.499 m)   Wt 124 lb 9.6 oz (56.5 kg)   SpO2 94%   BMI 25.17 kg/m , BMI Body mass index is 25.17 kg/m.  Wt Readings from Last 3 Encounters:  02/13/23 124 lb 9.6 oz (56.5 kg)  01/17/23 120 lb (54.4 kg)  11/09/22 120 lb 6.4 oz (54.6 kg)    General: Patient appears comfortable at rest. HEENT: Conjunctiva and lids normal, oropharynx clear with moist mucosa. Neck: Supple, no elevated JVP or carotid bruits, no thyromegaly. Lungs: Clear to auscultation, nonlabored breathing at rest. Cardiac: Regular rate and rhythm, no S3 or significant systolic murmur, no pericardial rub. Abdomen: Soft, nontender, no hepatomegaly, bowel sounds present, no guarding or rebound. Extremities: No pitting edema, distal pulses 2+. Skin: Warm and dry. Musculoskeletal: No kyphosis. Neuropsychiatric: Alert and oriented x3, affect grossly appropriate.  Recent Labwork: No results found for requested labs within last 365 days.  No results found for: "CHOL", "TRIG", "HDL", "CHOLHDL", "VLDL", "LDLCALC", "LDLDIRECT"  Other Studies Reviewed Today: Echocardiogram in 12/2022 LVEF 45 to 50% G2 DD RV systolic function is normal LA severely dilated RA severely dilated Mild MR Mild AR  Echocardiogram in 2023 LVEF normal  Echocardiogram in 2022 LVEF 20%  Assessment and Plan: Patient is a 77 year old F known to have tachycardia induced cardiomyopathy with LVEF 20% improved to 55% in 2023, paroxysmal atrial fibrillation, HTN, PAD, anxiety  was referred to cardiology clinic for new onset cardiomyopathy LVEF 45 to 50% on 12/2022 echocardiogram.  # New onset cardiomyopathy LVEF 45 to 50% -Patient had tachycardia induced cardiomyopathy with LVEF 20% in 2022 in the setting of atrial fibrillation with RVR. Repeat echo in 2023 showed normal LVEF after A-fib rates were controlled.  Pulmonology performed echo in 3/24 due to pulmonary hypertension and her LVEF was noted to be 45 to 50% with mild elevation of PASP.  Will initiate GDMT for mildly reduced LV systolic dysfunction.  Switch metoprolol to bisoprolol 5 mg once daily, start losartan 25 mg once daily (patient refused to be on Entresto due to cost and is not interested to fill patient assistance program at this time). She refused stress test and will wait on the CTA cardiac as she has no symptoms of DOE and  has mild CKD. Encourage patient to exert more and let me know if she has any symptoms of DOE in the next clinic visit so we can go ahead and perform CTA cardiac.  # Paroxysmal A-fib # Tachycardia induced cardiomyopathy -Patient has been on amiodarone since 2022. Recently she was diagnosed with new onset hypothyroidism for which she is currently taking Synthroid. Discontinue amiodarone and obtain TSH, free T3 and T4 levels. Pulmonology is already following up on the PFTs ordered recently. -Patient refused to be on systemic AC including Eliquis (due to cost issues) and Coumadin (due to frequent INR checks) despite counseling on the risks of stroke if not on AC.  # Moderate to severe PAD in 2021: Patient underwent ABI in 2021 that showed R resting ABI 0.67 and postexercise ABI 0.39, L resting ABI 0.58 and postexercise ABI 0.43.  She was seen by Dr. Arbie Cookey in 2021, did not have any claudication symptoms and was told to return back as needed basis for any progression of claudication symptoms. Will start aspirin and high intensity statin in the next clinic visit as she is worried about taking too  many medications already. Start losartan 25 mg once daily.  # Labs: Patient requested HbA1c to be ordered as well  I have spent a total of 45 minutes with patient reviewing chart, EKGs, labs and examining patient as well as establishing an assessment and plan that was discussed with the patient.  > 50% of time was spent in direct patient care.    Medication Adjustments/Labs and Tests Ordered: Current medicines are reviewed at length with the patient today.  Concerns regarding medicines are outlined above.   Tests Ordered: Orders Placed This Encounter  Procedures   TSH   T4, free   T3, free   HgB A1c   EKG 12-Lead    Medication Changes: Meds ordered this encounter  Medications   losartan (COZAAR) 25 MG tablet    Sig: Take 1 tablet (25 mg total) by mouth daily.    Dispense:  90 tablet    Refill:  3   bisoprolol (ZEBETA) 5 MG tablet    Sig: Take 1 tablet (5 mg total) by mouth daily.    Dispense:  90 tablet    Refill:  3    Disposition:  Follow up  3 months  Signed, Rocklyn Mayberry Verne Spurr, MD, 02/13/2023 12:31 PM    [facility] at [facility] [address], [place], [place] [address]

## 2023-02-13 NOTE — Patient Instructions (Signed)
Medication Instructions:  Your physician has recommended you make the following change in your medication:   -Stop Amiodarone -Stop Metoprolol -Start Bisoprolol 5 mg once daily -Start Losartan 25 mg once daily  *If you need a refill on your cardiac medications before your next appointment, please call your pharmacy*   Lab Work: Today: -TSH -FT4/FT3  If you have labs (blood work) drawn today and your tests are completely normal, you will receive your results only by: MyChart Message (if you have MyChart) OR A paper copy in the mail If you have any lab test that is abnormal or we need to change your treatment, we will call you to review the results.   Testing/Procedures: None   Follow-Up: At [facility], you and your health needs are our priority.  As part of our continuing mission to provide you with exceptional heart care, we have created designated Provider Care Teams.  These Care Teams include your primary Cardiologist (physician) and Advanced Practice Providers (APPs -  Physician Assistants and Nurse Practitioners) who all work together to provide you with the care you need, when you need it.  We recommend signing up for the patient portal called "MyChart".  Sign up information is provided on this After Visit Summary.  MyChart is used to connect with patients for Virtual Visits (Telemedicine).  Patients are able to view lab/test results, encounter notes, upcoming appointments, etc.  Non-urgent messages can be sent to your provider as well.   To learn more about what you can do with MyChart, go to [email or web address].    Your next appointment:   3 month(s)  Provider:   Luane School, MD    Other Instructions

## 2023-02-15 LAB — T3, FREE: T3, Free: 1.9 pg/mL — ABNORMAL LOW (ref 2.0–4.4)

## 2023-03-02 DIAGNOSIS — R600 Localized edema: Secondary | ICD-10-CM | POA: Diagnosis not present

## 2023-03-02 DIAGNOSIS — R0902 Hypoxemia: Secondary | ICD-10-CM | POA: Diagnosis not present

## 2023-03-02 DIAGNOSIS — J449 Chronic obstructive pulmonary disease, unspecified: Secondary | ICD-10-CM | POA: Diagnosis not present

## 2023-03-10 DIAGNOSIS — J9601 Acute respiratory failure with hypoxia: Secondary | ICD-10-CM | POA: Diagnosis not present

## 2023-03-12 ENCOUNTER — Other Ambulatory Visit (HOSPITAL_COMMUNITY): Payer: Self-pay | Admitting: Family Medicine

## 2023-03-12 ENCOUNTER — Ambulatory Visit (HOSPITAL_COMMUNITY)
Admission: RE | Admit: 2023-03-12 | Discharge: 2023-03-12 | Disposition: A | Discharge status: Home / Self Care | Payer: Medicare HMO | Source: Ambulatory Visit | Attending: Pulmonary Disease | Admitting: Pulmonary Disease

## 2023-03-12 DIAGNOSIS — J9611 Chronic respiratory failure with hypoxia: Secondary | ICD-10-CM | POA: Diagnosis not present

## 2023-03-12 DIAGNOSIS — Z1231 Encounter for screening mammogram for malignant neoplasm of breast: Secondary | ICD-10-CM

## 2023-03-12 LAB — PULMONARY FUNCTION TEST
DL/VA % pred: 31 %
DL/VA: 1.35 ml/min/mmHg/L
DLCO unc % pred: 22 %
DLCO unc: 3.56 ml/min/mmHg
FEF 25-75 Post: 0.16 L/sec
FEF 25-75 Pre: 0.16 L/sec
FEF2575-%Change-Post: 1 %
FEF2575-%Pred-Post: 11 %
FEF2575-%Pred-Pre: 11 %
FEV1-%Change-Post: -2 %
FEV1-%Pred-Post: 32 %
FEV1-%Pred-Pre: 32 %
FEV1-Post: 0.53 L
FEV1-Pre: 0.54 L
FEV1FVC-%Change-Post: -7 %
FEV1FVC-%Pred-Pre: 43 %
FEV6-%Change-Post: 0 %
FEV6-%Pred-Post: 60 %
FEV6-%Pred-Pre: 60 %
FEV6-Post: 1.27 L
FEV6-Pre: 1.27 L
FEV6FVC-%Change-Post: -5 %
FEV6FVC-%Pred-Post: 76 %
FEV6FVC-%Pred-Pre: 80 %
FVC-%Change-Post: 6 %
FVC-%Pred-Post: 78 %
FVC-%Pred-Pre: 74 %
FVC-Post: 1.76 L
FVC-Pre: 1.66 L
Post FEV1/FVC ratio: 30 %
Post FEV6/FVC ratio: 72 %
Pre FEV1/FVC ratio: 33 %
Pre FEV6/FVC Ratio: 76 %
RV % pred: 215 %
RV: 4.39 L
TLC % pred: 142 %
TLC: 6.15 L

## 2023-03-12 MED ORDER — ALBUTEROL SULFATE (2.5 MG/3ML) 0.083% IN NEBU
2.5000 mg | INHALATION_SOLUTION | Freq: Once | RESPIRATORY_TRACT | Status: AC
  Administered 2023-03-12: 2.5 mg via RESPIRATORY_TRACT

## 2023-03-27 ENCOUNTER — Ambulatory Visit (HOSPITAL_COMMUNITY)
Admission: RE | Admit: 2023-03-27 | Discharge: 2023-03-27 | Disposition: A | Discharge status: Home / Self Care | Payer: Medicare HMO | Source: Ambulatory Visit | Attending: Family Medicine | Admitting: Family Medicine

## 2023-03-27 ENCOUNTER — Encounter (HOSPITAL_COMMUNITY): Payer: Self-pay

## 2023-03-27 DIAGNOSIS — Z1231 Encounter for screening mammogram for malignant neoplasm of breast: Secondary | ICD-10-CM | POA: Diagnosis not present

## 2023-04-02 DIAGNOSIS — R600 Localized edema: Secondary | ICD-10-CM | POA: Diagnosis not present

## 2023-04-02 DIAGNOSIS — J449 Chronic obstructive pulmonary disease, unspecified: Secondary | ICD-10-CM | POA: Diagnosis not present

## 2023-04-02 DIAGNOSIS — R0902 Hypoxemia: Secondary | ICD-10-CM | POA: Diagnosis not present

## 2023-04-10 DIAGNOSIS — J9601 Acute respiratory failure with hypoxia: Secondary | ICD-10-CM | POA: Diagnosis not present

## 2023-04-25 DIAGNOSIS — N182 Chronic kidney disease, stage 2 (mild): Secondary | ICD-10-CM | POA: Diagnosis not present

## 2023-04-25 DIAGNOSIS — E039 Hypothyroidism, unspecified: Secondary | ICD-10-CM | POA: Diagnosis not present

## 2023-04-25 DIAGNOSIS — E782 Mixed hyperlipidemia: Secondary | ICD-10-CM | POA: Diagnosis not present

## 2023-04-25 DIAGNOSIS — E1165 Type 2 diabetes mellitus with hyperglycemia: Secondary | ICD-10-CM | POA: Diagnosis not present

## 2023-04-25 DIAGNOSIS — N183 Chronic kidney disease, stage 3 unspecified: Secondary | ICD-10-CM | POA: Diagnosis not present

## 2023-04-25 DIAGNOSIS — Z13220 Encounter for screening for lipoid disorders: Secondary | ICD-10-CM | POA: Diagnosis not present

## 2023-04-25 DIAGNOSIS — Z1329 Encounter for screening for other suspected endocrine disorder: Secondary | ICD-10-CM | POA: Diagnosis not present

## 2023-05-02 DIAGNOSIS — R0902 Hypoxemia: Secondary | ICD-10-CM | POA: Diagnosis not present

## 2023-05-02 DIAGNOSIS — Z6824 Body mass index (BMI) 24.0-24.9, adult: Secondary | ICD-10-CM | POA: Diagnosis not present

## 2023-05-02 DIAGNOSIS — I700 Atherosclerosis of aorta: Secondary | ICD-10-CM | POA: Diagnosis not present

## 2023-05-02 DIAGNOSIS — I5020 Unspecified systolic (congestive) heart failure: Secondary | ICD-10-CM | POA: Diagnosis not present

## 2023-05-02 DIAGNOSIS — I482 Chronic atrial fibrillation, unspecified: Secondary | ICD-10-CM | POA: Diagnosis not present

## 2023-05-02 DIAGNOSIS — N1831 Chronic kidney disease, stage 3a: Secondary | ICD-10-CM | POA: Diagnosis not present

## 2023-05-02 DIAGNOSIS — R030 Elevated blood-pressure reading, without diagnosis of hypertension: Secondary | ICD-10-CM | POA: Diagnosis not present

## 2023-05-02 DIAGNOSIS — F339 Major depressive disorder, recurrent, unspecified: Secondary | ICD-10-CM | POA: Diagnosis not present

## 2023-05-02 DIAGNOSIS — R600 Localized edema: Secondary | ICD-10-CM | POA: Diagnosis not present

## 2023-05-02 DIAGNOSIS — R911 Solitary pulmonary nodule: Secondary | ICD-10-CM | POA: Diagnosis not present

## 2023-05-02 DIAGNOSIS — E039 Hypothyroidism, unspecified: Secondary | ICD-10-CM | POA: Diagnosis not present

## 2023-05-02 DIAGNOSIS — J449 Chronic obstructive pulmonary disease, unspecified: Secondary | ICD-10-CM | POA: Diagnosis not present

## 2023-05-10 DIAGNOSIS — J9601 Acute respiratory failure with hypoxia: Secondary | ICD-10-CM | POA: Diagnosis not present

## 2023-05-22 ENCOUNTER — Encounter: Payer: Self-pay | Admitting: Internal Medicine

## 2023-05-22 ENCOUNTER — Ambulatory Visit: Payer: Medicare HMO | Attending: Internal Medicine | Admitting: Internal Medicine

## 2023-05-22 VITALS — BP 170/88 | HR 56 | Ht 59.0 in | Wt 126.8 lb

## 2023-05-22 DIAGNOSIS — I480 Paroxysmal atrial fibrillation: Secondary | ICD-10-CM | POA: Diagnosis not present

## 2023-05-22 NOTE — Progress Notes (Signed)
Cardiology Office Note  Date: 05/22/2023   ID: [name], DOB March 29, 1946, MRN [id number]  PCP:  Juliette Alcide, MD  Cardiologist:  Marjo Bicker, MD Electrophysiologist:  None    History of Present Illness: Stacy Johnson is a 77 y.o. female known to have tachycardia induced cardiomyopathy with LVEF 20% improved to 55% in 2023 with new drop in LVEF 45% in 3/24, paroxysmal atrial fibrillation (refused to be on [facility]), HTN, PAD, anxiety is here for follow-up visit.  Patient was admitted to [facility] 06/03/2021 with A-fib with RVR and new onset cardiomyopathy LVEF 20%. After A-fib rates are controlled, she was placed on amiodarone and discharged. Repeat echocardiogram in 2023 showed normal LVEF.  She has been seen by pulmonology who recommended echocardiogram that showed LVEF 45 to 50%.  She was having hypothyroidism symptoms due to long-term amiodarone use which was started in 2022 at [facility]. Amiodarone was discontinued and she followed up with PCP for management of hypothyroidism.  She is here for follow-up visit.  Continues to have SOB and thinks this is from her lungs.  Refuses ischemia evaluation with a stress test or CT cardiac.  Does not want to be on systemic anticoagulation for stroke prophylaxis and A-fib.  EKG today showed sinus bradycardia, HR 52 bpm.  No angina, palpitations, dizziness, leg swelling.  Patient underwent ABI in 2021 that showed R resting ABI 0.67 and postexercise ABI 0.39, L resting ABI 0.58 and postexercise ABI 0.43.  She was seen by Dr. Arbie Cookey in 2021, did not have any claudication symptoms and was told to return back as needed basis for any progression of claudication symptoms.  She has intermittent claudication symptoms but not severe.  She refused supervised exercise therapy program.  Past Medical History:  Diagnosis Date   Anxiety disorder    Body mass index 26.0-26.9, adult    Chronic obstructive pulmonary disease (COPD) (HCC)    Gangrene (HCC)     Hypertension    Localized edema     Past Surgical History:  Procedure Laterality Date   ABDOMINAL HYSTERECTOMY     COLONOSCOPY     less than 10 years    Current Outpatient Medications  Medication Sig Dispense Refill   albuterol (ACCUNEB) 1.25 MG/3ML nebulizer solution Take 1 ampule by nebulization every 6 (six) hours as needed for wheezing.     ALBUTEROL IN Inhale 2 puffs into the lungs every 4 (four) hours as needed.     bisoprolol (ZEBETA) 5 MG tablet Take 1 tablet (5 mg total) by mouth daily. 90 tablet 3   FLUoxetine (PROZAC) 10 MG tablet Take 10 mg by mouth daily.     furosemide (LASIX) 40 MG tablet Take 40 mg by mouth daily as needed.     levothyroxine (SYNTHROID) 100 MCG tablet Take 100 mcg by mouth daily.     losartan (COZAAR) 25 MG tablet Take 1 tablet (25 mg total) by mouth daily. 90 tablet 3   potassium chloride SA (KLOR-CON M) 20 MEQ tablet Take 20 mEq by mouth 2 (two) times daily.     rosuvastatin (CRESTOR) 10 MG tablet Take 10 mg by mouth daily.     TRELEGY ELLIPTA 100-62.5-25 MCG/ACT AEPB Take 1 puff by mouth daily.     indapamide (LOZOL) 1.25 MG tablet Take 1.25 mg by mouth daily. (Patient not taking: Reported on 05/22/2023)     No current facility-administered medications for this visit.   Allergies:  Patient has no known allergies.  Social History: The patient  reports that she quit smoking about 15 years ago. Her smoking use included cigarettes. She started smoking about 61 years ago. She has a 46 pack-year smoking history. She has never used smokeless tobacco. She reports that she does not currently use alcohol. She reports that she does not use drugs.   Family History: The patient's family history is not on file.   ROS:  Please see the history of present illness. Otherwise, complete review of systems is positive for none.  All other systems are reviewed and negative.   Physical Exam: VS:  Ht 4\' 11"  (1.499 m)   Wt 126 lb 12.8 oz (57.5 kg)   BMI 25.61 kg/m ,  BMI Body mass index is 25.61 kg/m.  Wt Readings from Last 3 Encounters:  05/22/23 126 lb 12.8 oz (57.5 kg)  02/13/23 124 lb 9.6 oz (56.5 kg)  01/17/23 120 lb (54.4 kg)    General: Patient appears comfortable at rest. HEENT: Conjunctiva and lids normal, oropharynx clear with moist mucosa. Neck: Supple, no elevated JVP or carotid bruits, no thyromegaly. Lungs: Clear to auscultation, nonlabored breathing at rest. Cardiac: Regular rate and rhythm, no S3 or significant systolic murmur, no pericardial rub. Abdomen: Soft, nontender, no hepatomegaly, bowel sounds present, no guarding or rebound. Extremities: No pitting edema, distal pulses 2+. Skin: Warm and dry. Musculoskeletal: No kyphosis. Neuropsychiatric: Alert and oriented x3, affect grossly appropriate.  Recent Labwork: 02/13/2023: TSH 6.989  No results found for: "CHOL", "TRIG", "HDL", "CHOLHDL", "VLDL", "LDLCALC", "LDLDIRECT"  Other Studies Reviewed Today: Echocardiogram in 12/2022 LVEF 45 to 50% G2 DD RV systolic function is normal LA severely dilated RA severely dilated Mild MR Mild AR  Echocardiogram in 2023 LVEF normal  Echocardiogram in 2022 LVEF 20%  Assessment and Plan: Patient is a 77 year old F known to have tachycardia induced cardiomyopathy with LVEF 20% improved to 55% in 2023 with new drop in LVEF 45% in 3/24, paroxysmal atrial fibrillation, HTN, PAD, anxiety is here for follow-up visit.  # New onset cardiomyopathy LVEF 45 to 50% # HFimpEF (20% improved to 55% in 2023, tachycardia induced) -Patient had tachycardia induced cardiomyopathy with LVEF 20% in 2022 in the setting of atrial fibrillation with RVR. Repeat echo in 2023 showed normal LVEF after A-fib rates were controlled.  Pulmonology performed echo in 3/24 due to pulmonary hypertension and her LVEF was noted to be 45 to 50% with mild elevation of PASP.  Continue GDMT with bisoprolol 5 mg once daily and losartan 25 mg once daily.  Patient refused ischemia  evaluation with CT cardiac and NM stress test.  She prefers medical management at this time.  No angina.  Continues to have SOB which she thinks is from her lungs.  # Paroxysmal A-fib (EKG today showed sinus bradycardia, HR 52 bpm) -On chronic amiodarone therapy since 2022 (started at [facility]) which was discontinued in the last clinic visit due to hypothyroidism symptoms.  Currently on levothyroxine. -Continue bisoprolol 5 mg once daily. Refuses to be on systemic anticoagulation including Eliquis due to cost issues and Coumadin due to frequent INR checks.  # Moderate to severe PAD in 2021: Patient underwent ABI in 2021 that showed R resting ABI 0.67 and postexercise ABI 0.39, L resting ABI 0.58 and postexercise ABI 0.43.  She was seen by Dr. Arbie Cookey in 2021, did not have any claudication symptoms and was told to return back as needed basis for any progression of claudication symptoms.  Currently has intermittent claudication symptoms. Refused  supervised exercise therapy for PAD. Currently on rosuvastatin 10 mg nightly, will need repeat lipid panel. Need to start aspirin 81 mg once daily.  # HTN, poorly controlled -Does not check blood pressures at home.  If BP at home continues to be elevated, no need to uptitrate losartan.  Patient agreeable to the plan.  I have spent a total of 30 minutes with patient reviewing chart, EKGs, labs and examining patient as well as establishing an assessment and plan that was discussed with the patient.  > 50% of time was spent in direct patient care.    Medication Adjustments/Labs and Tests Ordered: Current medicines are reviewed at length with the patient today.  Concerns regarding medicines are outlined above.   Tests Ordered: No orders of the defined types were placed in this encounter.   Medication Changes: No orders of the defined types were placed in this encounter.   Disposition:  Follow up  6 months  Signed, Terry Bolotin Verne Spurr, MD, 05/22/2023 10:54  AM    [facility] [address], [place], [place] [address]

## 2023-05-22 NOTE — Patient Instructions (Addendum)
Medication Instructions:  Your physician recommends that you continue on your current medications as directed. Please refer to the Current Medication list given to you today.  Labwork: none  Testing/Procedures: none  Follow-Up: Your physician recommends that you schedule a follow-up appointment in: 6 months  Any Other Special Instructions Will Be Listed Below (If Applicable). Your physician has requested that you regularly monitor and record your blood pressure readings at home. Please use the same machine at the same time of day to check your readings and record them. In one week, please send results to your family doctor or our office.   If you need a refill on your cardiac medications before your next appointment, please call your pharmacy.

## 2023-06-02 DIAGNOSIS — R600 Localized edema: Secondary | ICD-10-CM | POA: Diagnosis not present

## 2023-06-02 DIAGNOSIS — J449 Chronic obstructive pulmonary disease, unspecified: Secondary | ICD-10-CM | POA: Diagnosis not present

## 2023-06-02 DIAGNOSIS — R0902 Hypoxemia: Secondary | ICD-10-CM | POA: Diagnosis not present

## 2023-06-10 DIAGNOSIS — J9601 Acute respiratory failure with hypoxia: Secondary | ICD-10-CM | POA: Diagnosis not present

## 2023-06-19 ENCOUNTER — Ambulatory Visit (HOSPITAL_COMMUNITY)
Admission: RE | Admit: 2023-06-19 | Discharge: 2023-06-19 | Disposition: A | Discharge status: Home / Self Care | Payer: Medicare HMO | Source: Ambulatory Visit | Attending: Pulmonary Disease | Admitting: Pulmonary Disease

## 2023-06-19 DIAGNOSIS — R918 Other nonspecific abnormal finding of lung field: Secondary | ICD-10-CM | POA: Diagnosis not present

## 2023-06-19 DIAGNOSIS — R911 Solitary pulmonary nodule: Secondary | ICD-10-CM | POA: Diagnosis not present

## 2023-06-19 DIAGNOSIS — J432 Centrilobular emphysema: Secondary | ICD-10-CM | POA: Diagnosis not present

## 2023-06-19 DIAGNOSIS — I700 Atherosclerosis of aorta: Secondary | ICD-10-CM | POA: Diagnosis not present

## 2023-06-19 MED ORDER — IOHEXOL 300 MG/ML  SOLN
75.0000 mL | Freq: Once | INTRAMUSCULAR | Status: AC | PRN
  Administered 2023-06-19: 75 mL via INTRAVENOUS

## 2023-06-21 ENCOUNTER — Ambulatory Visit: Payer: Medicare HMO | Admitting: Pulmonary Disease

## 2023-06-21 ENCOUNTER — Encounter: Payer: Self-pay | Admitting: Pulmonary Disease

## 2023-06-21 VITALS — BP 142/61 | HR 60 | Ht 59.0 in | Wt 125.6 lb

## 2023-06-21 DIAGNOSIS — J4489 Other specified chronic obstructive pulmonary disease: Secondary | ICD-10-CM

## 2023-06-21 DIAGNOSIS — J439 Emphysema, unspecified: Secondary | ICD-10-CM

## 2023-06-21 DIAGNOSIS — J9611 Chronic respiratory failure with hypoxia: Secondary | ICD-10-CM | POA: Diagnosis not present

## 2023-06-21 DIAGNOSIS — R911 Solitary pulmonary nodule: Secondary | ICD-10-CM | POA: Diagnosis not present

## 2023-06-21 NOTE — Assessment & Plan Note (Signed)
Continue Trelegy. I offered her pulmonary rehab

## 2023-06-21 NOTE — Assessment & Plan Note (Signed)
Await official read on CT scan but to my review left upper lobe nodule as well as left lower lobe appear to be enlarging. We will schedule PET scan Unfortunately she is not a candidate for surgical resection due to very poor lung function.  She would be a high risk candidate to biopsy If she does not have significant mediastinal lymphadenopathy, can consider empiric SBRT

## 2023-06-21 NOTE — Patient Instructions (Signed)
Lung nodules are increasing in size  We will arrange for PET scan

## 2023-06-21 NOTE — Progress Notes (Unsigned)
   Subjective:    Patient ID: Cyril Mourning, female    DOB: 1946/10/07, 77 y.o.   MRN: [id number]  HPI  77 yo remote smoker for FU of COPD, chronic hypoxic respiratory failure and lung nodules. She has moderate to severe pulmonary hypertension,likely WHO 2/3  -has Inogen POC    She smoked more than 40 pack years up to 2 packs/day before she quit in 2004.  She was diagnosed with COPD in [facility] [place], [place] by pulmonologist Dr. Lynnae Prude . She reports pulmonary nodules that were stable on follow-up, she refused biopsy     PMH -  HFrEF -LVEF 20% improved to 55% in 2023  Atrial fibrillation RVR, stopped Eliquis does not want Coumadin, does not want cards referral Right breast cancer s/p lumpectomy and RT 2005 CKD stage III hypothyroidism due to long-term amiodarone use   78-month follow-up visit. After her last visit, she was referred to cardiology for evaluation of drop in EF to 45%.  She was started on bisoprolol and losartan.  She refused ischemia evaluation. We reviewed PFTs today. She has POC but is noncompliant with this, she arrives with oxygen saturation 97% today. We reviewed CT chest    Significant tests/ events reviewed   PFTs 02/2023 ratio 33, FEV1 32%, FVC 74%, TLC 140%, DLCO 3.56/22%  11/09/22 On ambulation saturation dropped from 90% to 84% and required 3 L of oxygen to recover and maintain while walking.   06/2023 CT chest >>  enlarged paraspinal left lower lobe pulmonary nodule, now 2.6 x 1.4 cm, other 2 nodules are stable, new T5 compression fracture   11/2022 CT chest without con>> left upper lobe subpleural nodule slightly increased in size to 11 x 8 mm, previously 9 x 6 mm, new left lower lobe nodule 5 mm, new groundglass opacity right lower lobe with right upper paratracheal lymph node subcentimeter   02/2018 CT chest >> severe apical bullous emphysema, peripheral left upper lobe pulmonary nodule   02/15/2021 Walked 2lpm approx 100 ft @ moderate pace  stopped due to pulse 170 No sob with sats still 98% Vs sats 86% RA (clinically 02 dep since 11/2020      Echo 12/2022 EF 45 to 50%, global hypokinesis, RVSP 38   Echo 12/2021 grade 2 diastolic dysfunction, EF 55 to 60%, RVSP 58 , moderate TR, moderate to severe pulm hypertension    Review of Systems neg for any significant sore throat, dysphagia, itching, sneezing, nasal congestion or excess/ purulent secretions, fever, chills, sweats, unintended wt loss, pleuritic or exertional cp, hempoptysis, orthopnea pnd or change in chronic leg swelling. Also denies presyncope, palpitations, heartburn, abdominal pain, nausea, vomiting, diarrhea or change in bowel or urinary habits, dysuria,hematuria, rash, arthralgias, visual complaints, headache, numbness weakness or ataxia.      Objective:   Physical Exam  Gen. Pleasant, well-nourished, in no distress ENT - no thrush, no pallor/icterus,no post nasal drip Neck: No JVD, no thyromegaly, no carotid bruits Lungs: no use of accessory muscles, no dullness to percussion, decreased bilateral without rales or rhonchi  Cardiovascular: Rhythm regular, heart sounds  normal, no murmurs or gallops, no peripheral edema Musculoskeletal: No deformities, no cyanosis or clubbing         Assessment & Plan:

## 2023-06-21 NOTE — Assessment & Plan Note (Signed)
I emphasized use of oxygen

## 2023-06-24 DIAGNOSIS — J449 Chronic obstructive pulmonary disease, unspecified: Secondary | ICD-10-CM | POA: Diagnosis not present

## 2023-06-24 DIAGNOSIS — R911 Solitary pulmonary nodule: Secondary | ICD-10-CM | POA: Diagnosis not present

## 2023-06-24 DIAGNOSIS — K644 Residual hemorrhoidal skin tags: Secondary | ICD-10-CM | POA: Diagnosis not present

## 2023-06-24 DIAGNOSIS — I5020 Unspecified systolic (congestive) heart failure: Secondary | ICD-10-CM | POA: Diagnosis not present

## 2023-06-24 DIAGNOSIS — R030 Elevated blood-pressure reading, without diagnosis of hypertension: Secondary | ICD-10-CM | POA: Diagnosis not present

## 2023-06-24 DIAGNOSIS — I482 Chronic atrial fibrillation, unspecified: Secondary | ICD-10-CM | POA: Diagnosis not present

## 2023-06-24 DIAGNOSIS — Z6824 Body mass index (BMI) 24.0-24.9, adult: Secondary | ICD-10-CM | POA: Diagnosis not present

## 2023-06-24 DIAGNOSIS — N1831 Chronic kidney disease, stage 3a: Secondary | ICD-10-CM | POA: Diagnosis not present

## 2023-06-26 ENCOUNTER — Telehealth: Payer: Self-pay | Admitting: Pulmonary Disease

## 2023-06-26 NOTE — Telephone Encounter (Signed)
[facility]J calling with call report. For CT scan. [facility] phone number is [phone].

## 2023-06-26 NOTE — Telephone Encounter (Signed)
Report reviewed by provider.

## 2023-06-27 ENCOUNTER — Ambulatory Visit (HOSPITAL_COMMUNITY)
Admission: RE | Admit: 2023-06-27 | Discharge: 2023-06-27 | Disposition: A | Discharge status: Home / Self Care | Payer: Medicare HMO | Source: Ambulatory Visit | Attending: Pulmonary Disease | Admitting: Pulmonary Disease

## 2023-06-27 DIAGNOSIS — R911 Solitary pulmonary nodule: Secondary | ICD-10-CM | POA: Insufficient documentation

## 2023-06-27 MED ORDER — FLUDEOXYGLUCOSE F - 18 (FDG) INJECTION
6.3000 | Freq: Once | INTRAVENOUS | Status: AC | PRN
  Administered 2023-06-27: 6.3 via INTRAVENOUS

## 2023-07-02 ENCOUNTER — Ambulatory Visit (HOSPITAL_COMMUNITY): Admission: RE | Admit: 2023-07-02 | Payer: Medicare HMO | Source: Ambulatory Visit

## 2023-07-02 ENCOUNTER — Encounter: Payer: Self-pay | Admitting: Gastroenterology

## 2023-07-02 ENCOUNTER — Telehealth (HOSPITAL_BASED_OUTPATIENT_CLINIC_OR_DEPARTMENT_OTHER): Payer: Self-pay | Admitting: Pulmonary Disease

## 2023-07-02 DIAGNOSIS — R911 Solitary pulmonary nodule: Secondary | ICD-10-CM

## 2023-07-02 NOTE — Progress Notes (Unsigned)
GI Office Note    Referring Provider: Juliette Alcide, MD Primary Care Physician:  Juliette Alcide, MD  Primary Gastroenterologist:  Chief Complaint   No chief complaint on file.    History of Present Illness   Stacy Johnson is a 77 y.o. female presenting today at the request of Dr. Leandrew Koyanagi for external hemorrhoid and colonoscopy.   Having rectal bleeding for couple of weeks on toilet tissue.   May have lung cancer, scheduled for PET-CT.   Labs 04/2023: WBC 6000, Hgb 12.2, Platelets 255, glucose 75, creatinine 0.84, LFTs normal.    Hemorrhoid treatment and ?colonoscopy/?lungs      Medications   Current Outpatient Medications  Medication Sig Dispense Refill   albuterol (ACCUNEB) 1.25 MG/3ML nebulizer solution Take 1 ampule by nebulization every 6 (six) hours as needed for wheezing.     ALBUTEROL IN Inhale 2 puffs into the lungs every 4 (four) hours as needed.     bisoprolol (ZEBETA) 5 MG tablet Take 1 tablet (5 mg total) by mouth daily. 90 tablet 3   FLUoxetine (PROZAC) 10 MG tablet Take 10 mg by mouth daily.     furosemide (LASIX) 40 MG tablet Take 40 mg by mouth daily as needed.     indapamide (LOZOL) 1.25 MG tablet Take 1.25 mg by mouth daily. (Patient not taking: Reported on 05/22/2023)     levothyroxine (SYNTHROID) 100 MCG tablet Take 100 mcg by mouth daily.     losartan (COZAAR) 25 MG tablet Take 1 tablet (25 mg total) by mouth daily. 90 tablet 3   potassium chloride SA (KLOR-CON M) 20 MEQ tablet Take 20 mEq by mouth 2 (two) times daily.     rosuvastatin (CRESTOR) 10 MG tablet Take 10 mg by mouth daily.     TRELEGY ELLIPTA 100-62.5-25 MCG/ACT AEPB Take 1 puff by mouth daily.     No current facility-administered medications for this visit.    Allergies   Allergies as of 07/03/2023   (No Known Allergies)    Past Medical History   Past Medical History:  Diagnosis Date   Anxiety disorder    Body mass index 26.0-26.9, adult    Chronic obstructive  pulmonary disease (COPD) (HCC)    Gangrene (HCC)    Hypertension    Localized edema     Past Surgical History   Past Surgical History:  Procedure Laterality Date   ABDOMINAL HYSTERECTOMY     COLONOSCOPY     less than 10 years    Past Family History   No family history on file.  Past Social History   Social History   Socioeconomic History   Marital status: Widowed    Spouse name: Not on file   Number of children: Not on file   Years of education: Not on file   Highest education level: Not on file  Occupational History   Not on file  Tobacco Use   Smoking status: Former    Current packs/day: 0.00    Average packs/day: 1 pack/day for 46.0 years (46.0 ttl pk-yrs)    Types: Cigarettes    Start date: 10/15/1961    Quit date: 10/16/2007    Years since quitting: 15.7   Smokeless tobacco: Never  Vaping Use   Vaping status: Never Used  Substance and Sexual Activity   Alcohol use: Not Currently   Drug use: Never   Sexual activity: Not on file  Other Topics Concern   Not on file  Social  History Narrative   Not on file   Social Determinants of Health   Financial Resource Strain: Not on file  Food Insecurity: Not on file  Transportation Needs: Not on file  Physical Activity: Not on file  Stress: Not on file  Social Connections: Not on file  Intimate Partner Violence: Not on file    Review of Systems   General: Negative for anorexia, weight loss, fever, chills, fatigue, weakness. Eyes: Negative for vision changes.  ENT: Negative for hoarseness, difficulty swallowing , nasal congestion. CV: Negative for chest pain, angina, palpitations, dyspnea on exertion, peripheral edema.  Respiratory: Negative for dyspnea at rest, dyspnea on exertion, cough, sputum, wheezing.  GI: See history of present illness. GU:  Negative for dysuria, hematuria, urinary incontinence, urinary frequency, nocturnal urination.  MS: Negative for joint pain, low back pain.  Derm: Negative for  rash or itching.  Neuro: Negative for weakness, abnormal sensation, seizure, frequent headaches, memory loss,  confusion.  Psych: Negative for anxiety, depression, suicidal ideation, hallucinations.  Endo: Negative for unusual weight change.  Heme: Negative for bruising or bleeding. Allergy: Negative for rash or hives.  Physical Exam   There were no vitals taken for this visit.   General: Well-nourished, well-developed in no acute distress.  Head: Normocephalic, atraumatic.   Eyes: Conjunctiva pink, no icterus. Mouth: Oropharyngeal mucosa moist and pink , no lesions erythema or exudate. Neck: Supple without thyromegaly, masses, or lymphadenopathy.  Lungs: Clear to auscultation bilaterally.  Heart: Regular rate and rhythm, no murmurs rubs or gallops.  Abdomen: Bowel sounds are normal, nontender, nondistended, no hepatosplenomegaly or masses,  no abdominal bruits or hernia, no rebound or guarding.   Rectal: *** Extremities: No lower extremity edema. No clubbing or deformities.  Neuro: Alert and oriented x 4 , grossly normal neurologically.  Skin: Warm and dry, no rash or jaundice.   Psych: Alert and cooperative, normal mood and affect.  Labs   *** Imaging Studies   CT Chest W Contrast  Addendum Date: 06/26/2023   ADDENDUM REPORT: 06/26/2023 12:39 ADDENDUM: These results will be called to the ordering clinician or representative by the Radiologist Assistant, and communication documented in the PACS or [employer]. Electronically Signed   By: Kennith Center M.D.   On: 06/26/2023 12:39   Result Date: 06/26/2023 CLINICAL DATA:  Pulmonary nodule EXAM: CT CHEST WITH CONTRAST TECHNIQUE: Multidetector CT imaging of the chest was performed during intravenous contrast administration. RADIATION DOSE REDUCTION: This exam was performed according to the departmental dose-optimization program which includes automated exposure control, adjustment of the mA and/or kV according to patient size  and/or use of iterative reconstruction technique. CONTRAST:  75mL OMNIPAQUE IOHEXOL 300 MG/ML  SOLN COMPARISON:  11/21/2022 FINDINGS: Cardiovascular: The heart is enlarged. No substantial pericardial effusion. Coronary artery calcification is evident. Moderate atherosclerotic calcification is noted in the wall of the thoracic aorta. Enlargement of the pulmonary outflow tract/main pulmonary arteries suggests pulmonary arterial hypertension. Mediastinum/Nodes: No mediastinal lymphadenopathy. There is no hilar lymphadenopathy. The esophagus has normal imaging features. There is no axillary lymphadenopathy. Lungs/Pleura: Centrilobular and paraseptal emphysema evident. Bullous changes with architectural distortion and scarring noted in the upper lungs bilaterally. 11 x 9 mm anterior left upper lobe nodule seen previously is 12 x 9 mm today on image 71/3. 9 x 4 mm paraspinal left lower lobe nodule seen previously is now a 2.6 x 1.4 cm irregular spiculated lesion on image 94/3. 5 mm left lower lobe pulmonary nodule identified as new on the previous  study is stable on 102/3 today. No pleural effusion. Upper Abdomen: Visualized portion of the upper abdomen is unremarkable. Musculoskeletal: No worrisome lytic or sclerotic osseous abnormality. Mild compression deformity at T4 is stable in the interval. New inferior endplate compression deformity at T5. IMPRESSION: 1. Interval enlargement of the paraspinal left lower lobe pulmonary nodule, now a 2.6 x 1.4 cm irregular spiculated lesion. Imaging features are highly suspicious for malignancy. PET-CT would likely prove helpful to further evaluate. 2. Stable 11 x 9 mm anterior left upper lobe pulmonary nodule. 3. Stable 5 mm left lower lobe pulmonary nodule. 4. New inferior endplate compression deformity at T5. 5. Aortic Atherosclerosis (ICD10-I70.0) and Emphysema (ICD10-J43.9). Electronically Signed: By: Kennith Center M.D. On: 06/26/2023 12:36    Assessment       PLAN    ***   Leanna Battles. Melvyn Neth, MHS, PA-C [facility]

## 2023-07-03 ENCOUNTER — Encounter: Payer: Self-pay | Admitting: Gastroenterology

## 2023-07-03 ENCOUNTER — Ambulatory Visit: Payer: Medicare HMO | Admitting: Gastroenterology

## 2023-07-03 VITALS — BP 174/78 | HR 65 | Temp 97.7°F | Ht 59.0 in | Wt 127.0 lb

## 2023-07-03 DIAGNOSIS — R0902 Hypoxemia: Secondary | ICD-10-CM | POA: Diagnosis not present

## 2023-07-03 DIAGNOSIS — K625 Hemorrhage of anus and rectum: Secondary | ICD-10-CM | POA: Insufficient documentation

## 2023-07-03 DIAGNOSIS — K649 Unspecified hemorrhoids: Secondary | ICD-10-CM | POA: Insufficient documentation

## 2023-07-03 DIAGNOSIS — R600 Localized edema: Secondary | ICD-10-CM | POA: Diagnosis not present

## 2023-07-03 DIAGNOSIS — J449 Chronic obstructive pulmonary disease, unspecified: Secondary | ICD-10-CM | POA: Diagnosis not present

## 2023-07-03 DIAGNOSIS — K641 Second degree hemorrhoids: Secondary | ICD-10-CM | POA: Diagnosis not present

## 2023-07-03 NOTE — Patient Instructions (Signed)
Start using your hemorrhoid cream (Procto-med) at least twice a day even if you don't have a bowel movement. Use consistently for 2 weeks. Put a little of the medication on a gloved finger and apply just inside the anal canal.  I will discuss your case with Dr. Marletta Lor here. I don't think you are a candidate for a colonoscopy based on your lung disease. I will look for your PET scan results to see if this gives [place] any additional information about your colon or rectum as they may have scanned this part of your body as well. We will make a return office visit for you to see Stacy Loron, NP in 07/2023 to consider hemorrhoid banding at that time if needed. Please call your cardiologist about ongoing elevated blood pressures.

## 2023-07-04 ENCOUNTER — Telehealth: Payer: Self-pay | Admitting: Internal Medicine

## 2023-07-04 NOTE — Telephone Encounter (Signed)
I will forward to Dr.Mallipeddi for review.

## 2023-07-04 NOTE — Telephone Encounter (Signed)
Pt c/o BP issue: STAT if pt c/o blurred vision, one-sided weakness or slurred speech  1. What are your last 5 BP readings? 178/78  2. Are you having any other symptoms (ex. Dizziness, headache, blurred vision, passed out)? No   3. What is your BP issue? Experiencing high BP and want to know if they can up the dosage on the losartan 25 mg

## 2023-07-05 MED ORDER — LOSARTAN POTASSIUM 50 MG PO TABS: 50.0000 mg | ORAL_TABLET | Freq: Every day | ORAL | 3 refills | Status: DC

## 2023-07-05 NOTE — Telephone Encounter (Signed)
I spoke with patient,she will increase losartan to 50 mg every day,90 day supply requested sent to [facility] [place]

## 2023-07-09 NOTE — Telephone Encounter (Signed)
Ct ordered with note that needs to schedule prior to 10/29 visit.

## 2023-07-11 DIAGNOSIS — J9601 Acute respiratory failure with hypoxia: Secondary | ICD-10-CM | POA: Diagnosis not present

## 2023-07-29 DIAGNOSIS — I739 Peripheral vascular disease, unspecified: Secondary | ICD-10-CM | POA: Diagnosis not present

## 2023-07-29 DIAGNOSIS — F339 Major depressive disorder, recurrent, unspecified: Secondary | ICD-10-CM | POA: Diagnosis not present

## 2023-07-29 DIAGNOSIS — E039 Hypothyroidism, unspecified: Secondary | ICD-10-CM | POA: Diagnosis not present

## 2023-07-29 DIAGNOSIS — R911 Solitary pulmonary nodule: Secondary | ICD-10-CM | POA: Diagnosis not present

## 2023-07-29 DIAGNOSIS — Z6824 Body mass index (BMI) 24.0-24.9, adult: Secondary | ICD-10-CM | POA: Diagnosis not present

## 2023-07-29 DIAGNOSIS — I5020 Unspecified systolic (congestive) heart failure: Secondary | ICD-10-CM | POA: Diagnosis not present

## 2023-07-29 DIAGNOSIS — I700 Atherosclerosis of aorta: Secondary | ICD-10-CM | POA: Diagnosis not present

## 2023-07-29 DIAGNOSIS — J449 Chronic obstructive pulmonary disease, unspecified: Secondary | ICD-10-CM | POA: Diagnosis not present

## 2023-07-29 DIAGNOSIS — I482 Chronic atrial fibrillation, unspecified: Secondary | ICD-10-CM | POA: Diagnosis not present

## 2023-07-29 DIAGNOSIS — I429 Cardiomyopathy, unspecified: Secondary | ICD-10-CM | POA: Diagnosis not present

## 2023-07-29 DIAGNOSIS — R030 Elevated blood-pressure reading, without diagnosis of hypertension: Secondary | ICD-10-CM | POA: Diagnosis not present

## 2023-07-30 ENCOUNTER — Ambulatory Visit (HOSPITAL_COMMUNITY)
Admission: RE | Admit: 2023-07-30 | Discharge: 2023-07-30 | Disposition: A | Discharge status: Home / Self Care | Payer: Medicare HMO | Source: Ambulatory Visit | Attending: Pulmonary Disease | Admitting: Pulmonary Disease

## 2023-07-30 DIAGNOSIS — R911 Solitary pulmonary nodule: Secondary | ICD-10-CM | POA: Insufficient documentation

## 2023-07-30 DIAGNOSIS — R918 Other nonspecific abnormal finding of lung field: Secondary | ICD-10-CM | POA: Diagnosis not present

## 2023-07-30 DIAGNOSIS — I77810 Thoracic aortic ectasia: Secondary | ICD-10-CM | POA: Diagnosis not present

## 2023-08-01 ENCOUNTER — Ambulatory Visit: Payer: Medicare HMO | Admitting: Gastroenterology

## 2023-08-01 ENCOUNTER — Encounter: Payer: Self-pay | Admitting: Gastroenterology

## 2023-08-01 VITALS — BP 187/83 | HR 58 | Temp 97.8°F | Ht 59.0 in | Wt 127.6 lb

## 2023-08-01 DIAGNOSIS — K641 Second degree hemorrhoids: Secondary | ICD-10-CM

## 2023-08-01 DIAGNOSIS — K648 Other hemorrhoids: Secondary | ICD-10-CM | POA: Diagnosis not present

## 2023-08-01 NOTE — Progress Notes (Signed)
      [facility] BANDING PROCEDURE NOTE  RANETTE LUCKADOO is a 77 y.o. female presenting today for consideration of hemorrhoid banding. Last colonoscopy in remote past. She is not a candidate for sedation reportedly due to respiratory status. She is here for hemorrhoid banding. Notes bleeding, itching, pressure, leaking/soiling.    The patient presents with symptomatic grade 2-3 hemorrhoids, unresponsive to maximal medical therapy, requesting rubber band ligation of her hemorrhoidal disease. All risks, benefits, and alternative forms of therapy were described and informed consent was obtained.  In the left lateral decubitus position, anoscopic examination revealed grade 2-3 hemorrhoids in all positions.   The decision was made to band the left lateral internal hemorrhoid, and the [facility] O'Regan System was used to perform band ligation without complication. Digital anorectal examination was then performed to assure proper positioning of the band, and to adjust the banded tissue as required. The patient was discharged home without pain or other issues. Dietary and behavioral recommendations were given, along with follow-up instructions. The patient will return in several weeks for followup and possible additional banding as required.  No complications were encountered and the patient tolerated the procedure well.   Gelene Mink, PhD, ANP-BC [facility]

## 2023-08-01 NOTE — Patient Instructions (Signed)
Please avoid straining.  You should limit your toilet time to 2-3 minutes at the most.   I recommend Benefiber 2 teaspoons each morning in the beverage of your choice!  Please call me with any concerns or issues!  I will see you in follow-up for additional banding in several weeks.   I enjoyed seeing you again today! I value our relationship and want to provide genuine, compassionate, and quality care. You may receive a survey regarding your visit with me, and I welcome your feedback! Thanks so much for taking the time to complete this. I look forward to seeing you again.      Gelene Mink, PhD, ANP-BC [facility]

## 2023-08-02 DIAGNOSIS — J449 Chronic obstructive pulmonary disease, unspecified: Secondary | ICD-10-CM | POA: Diagnosis not present

## 2023-08-02 DIAGNOSIS — R600 Localized edema: Secondary | ICD-10-CM | POA: Diagnosis not present

## 2023-08-02 DIAGNOSIS — R0902 Hypoxemia: Secondary | ICD-10-CM | POA: Diagnosis not present

## 2023-08-10 DIAGNOSIS — J9601 Acute respiratory failure with hypoxia: Secondary | ICD-10-CM | POA: Diagnosis not present

## 2023-08-13 ENCOUNTER — Encounter (HOSPITAL_BASED_OUTPATIENT_CLINIC_OR_DEPARTMENT_OTHER): Payer: Self-pay | Admitting: Pulmonary Disease

## 2023-08-13 ENCOUNTER — Ambulatory Visit (HOSPITAL_BASED_OUTPATIENT_CLINIC_OR_DEPARTMENT_OTHER): Payer: Medicare HMO | Admitting: Pulmonary Disease

## 2023-08-13 VITALS — BP 142/70 | HR 64 | Ht 59.0 in | Wt 127.4 lb

## 2023-08-13 DIAGNOSIS — Z23 Encounter for immunization: Secondary | ICD-10-CM | POA: Diagnosis not present

## 2023-08-13 DIAGNOSIS — R911 Solitary pulmonary nodule: Secondary | ICD-10-CM

## 2023-08-13 DIAGNOSIS — J4489 Other specified chronic obstructive pulmonary disease: Secondary | ICD-10-CM

## 2023-08-13 DIAGNOSIS — J439 Emphysema, unspecified: Secondary | ICD-10-CM | POA: Diagnosis not present

## 2023-08-13 DIAGNOSIS — J9611 Chronic respiratory failure with hypoxia: Secondary | ICD-10-CM | POA: Diagnosis not present

## 2023-08-13 NOTE — Progress Notes (Signed)
Subjective:    Patient ID: Stacy Johnson, female    DOB: 1946-02-23, 77 y.o.   MRN: [id number]  HPI  77 yo remote smoker for FU of COPD, chronic hypoxic respiratory failure and lung nodules. She has moderate to severe pulmonary hypertension,likely WHO 2/3  -has Inogen POC    She smoked more than 40 pack years up to 2 packs/day before she quit in 2004.  She was diagnosed with COPD in [facility] [place], [place] by pulmonologist Dr. Lynnae Prude . She reports pulmonary nodules that were stable on follow-up, she refused biopsy     PMH -  HFrEF -LVEF 20% improved to 55% in 2023  Atrial fibrillation RVR, stopped Eliquis does not want Coumadin, does not want cards referral Right breast cancer s/p lumpectomy and RT 2005 CKD stage III hypothyroidism due to long-term amiodarone use   37-month follow-up visit PET 06/2023 Left lower lobe bandlike nodular thickening appears less spiculated and stretched out more like scarring with low SUV of 3.6.  We repeated CT chest without contrast 07/30/2023 which shows left lower lobe lesion is smaller with air bronchograms?  Resolving inflammation.  Left upper lobe 12 mm nodule is stable and dates back to 2018  She comes in without her oxygen today and saturation is 87%.  Compliant with Trelegy.  She complains of cough but clear sputum production, no wheezing  Significant tests/ events reviewed   PFTs 02/2023 ratio 33, FEV1 32%, FVC 74%, TLC 140%, DLCO 3.56/22%   11/09/22 On ambulation saturation dropped from 90% to 84% and required 3 L of oxygen to recover and maintain while walking.    06/2023 CT chest >>  enlarged paraspinal left lower lobe pulmonary nodule, now 2.6 x 1.4 cm, other 2 nodules are stable, new T5 compression fracture     11/2022 CT chest without con>> left upper lobe subpleural nodule slightly increased in size to 11 x 8 mm, previously 9 x 6 mm, new left lower lobe nodule 5 mm, new groundglass opacity right lower lobe with right upper  paratracheal lymph node subcentimeter   02/2018 CT chest >> severe apical bullous emphysema, peripheral left upper lobe pulmonary nodule   02/15/2021 Walked 2lpm approx 100 ft @ moderate pace stopped due to pulse 170 No sob with sats still 98% Vs sats 86% RA (clinically 02 dep since 11/2020      Echo 12/2022 EF 45 to 50%, global hypokinesis, RVSP 38   Echo 12/2021 grade 2 diastolic dysfunction, EF 55 to 60%, RVSP 58 , moderate TR, moderate to severe pulm hypertension  Review of Systems neg for any significant sore throat, dysphagia, itching, sneezing, nasal congestion or excess/ purulent secretions, fever, chills, sweats, unintended wt loss, pleuritic or exertional cp, hempoptysis, orthopnea pnd or change in chronic leg swelling. Also denies presyncope, palpitations, heartburn, abdominal pain, nausea, vomiting, diarrhea or change in bowel or urinary habits, dysuria,hematuria, rash, arthralgias, visual complaints, headache, numbness weakness or ataxia.     Objective:   Physical Exam  Gen. Pleasant, well-nourished, in no distress ENT - no thrush, no pallor/icterus,no post nasal drip Neck: No JVD, no thyromegaly, no carotid bruits Lungs: no use of accessory muscles, no dullness to percussion, clear without rales or rhonchi  Cardiovascular: Rhythm regular, heart sounds  normal, no murmurs or gallops, no peripheral edema Musculoskeletal: No deformities, no cyanosis or clubbing        Assessment & Plan:

## 2023-08-13 NOTE — Patient Instructions (Signed)
X Ct chest wo con in feb 2025 @ [facility]  X Trelegy 100 sample x 2

## 2023-08-13 NOTE — Assessment & Plan Note (Signed)
Left lower lobe nodule appears to be inflammatory with resolving air bronchograms.  Left upper lobe nodule dates back to 2018 and is likely benign, low-grade hypermetabolic on PET. Will continue 4 to 38-month surveillance until full resolution

## 2023-08-13 NOTE — Assessment & Plan Note (Signed)
Continue oxygen at all times

## 2023-08-13 NOTE — Assessment & Plan Note (Signed)
Continue Trelegy. Albuterol for rescue We discussed signs and symptoms of COPD exacerbation and plan for the same Asked her to use Mucinex and albuterol for current symptoms of cough with clear sputum

## 2023-08-22 ENCOUNTER — Telehealth (HOSPITAL_BASED_OUTPATIENT_CLINIC_OR_DEPARTMENT_OTHER): Payer: Self-pay | Admitting: Pulmonary Disease

## 2023-08-23 ENCOUNTER — Telehealth: Payer: Self-pay | Admitting: Gastroenterology

## 2023-08-23 DIAGNOSIS — Z1211 Encounter for screening for malignant neoplasm of colon: Secondary | ICD-10-CM

## 2023-08-23 NOTE — Telephone Encounter (Signed)
Please let pt know that I discussed her case with Dr. Jena Gauss regarding rectal bleeding and potentially doing a colonoscopy. Reviewed her labs from PCP, hgb normal.  Patient has significant lung disease and has been told she is not a candidate for surgery. We believe her rectal bleeding is due to hemorrhoids and she is seeing Tobi Bastos for banding.   He suggested that we consider doing a cologuard (after patient has completed her hemorrhoid banding and had time to heal, waiting at least one month after her last planned banding). If cologuard is negative, this would be more reassuring that she does not have any significant underlying pathology and her bleeding is due to hemorrhoids.

## 2023-08-26 NOTE — Telephone Encounter (Signed)
Pt was made aware and verbalized understanding.  

## 2023-08-27 NOTE — Telephone Encounter (Signed)
I placed order for cologuard. Remind patient to not complete until one month after she finishes her last banding session.

## 2023-08-27 NOTE — Addendum Note (Signed)
Addended by: Tiffany Kocher on: 08/27/2023 09:47 PM   Modules accepted: Orders

## 2023-08-28 NOTE — Telephone Encounter (Signed)
Made pt aware

## 2023-08-29 ENCOUNTER — Encounter: Payer: Self-pay | Admitting: Gastroenterology

## 2023-08-29 ENCOUNTER — Ambulatory Visit: Payer: Medicare HMO | Admitting: Gastroenterology

## 2023-08-29 VITALS — BP 129/67 | HR 60 | Temp 98.0°F | Ht 60.0 in | Wt 128.8 lb

## 2023-08-29 DIAGNOSIS — K642 Third degree hemorrhoids: Secondary | ICD-10-CM

## 2023-08-29 DIAGNOSIS — K641 Second degree hemorrhoids: Secondary | ICD-10-CM

## 2023-08-29 NOTE — Telephone Encounter (Signed)
Gave patient results again  Nothing further needed.

## 2023-08-29 NOTE — Progress Notes (Signed)
    [facility] BANDING PROCEDURE NOTE  Stacy Johnson is a 77 y.o. female presenting today for consideration of hemorrhoid banding. Last colonoscopy remote past. Not candidate for sedation reportedly due to respiratory status. She has had left lateral banding. Known Grade 2-3 hemorrhoids on anoscopy last week.    The patient presents with symptomatic grade 2-3 hemorrhoids, unresponsive to maximal medical therapy, requesting rubber band ligation of her hemorrhoidal disease. All risks, benefits, and alternative forms of therapy were described and informed consent was obtained.  The decision was made to band the right posterior internal hemorrhoid, and the [facility] O'Regan System was used to perform band ligation without complication. Digital anorectal examination was then performed to assure proper positioning of the band, and to adjust the banded tissue as required. The patient was discharged home without pain or other issues. Dietary and behavioral recommendations were given, along with follow-up instructions. The patient will return in several weeks for followup and possible additional banding as required.  No complications were encountered and the patient tolerated the procedure well.   Gelene Mink, PhD, ANP-BC [facility]astroenterology

## 2023-08-29 NOTE — Patient Instructions (Signed)

## 2023-09-02 DIAGNOSIS — R600 Localized edema: Secondary | ICD-10-CM | POA: Diagnosis not present

## 2023-09-02 DIAGNOSIS — R0902 Hypoxemia: Secondary | ICD-10-CM | POA: Diagnosis not present

## 2023-09-02 DIAGNOSIS — J449 Chronic obstructive pulmonary disease, unspecified: Secondary | ICD-10-CM | POA: Diagnosis not present

## 2023-09-10 DIAGNOSIS — J9601 Acute respiratory failure with hypoxia: Secondary | ICD-10-CM | POA: Diagnosis not present

## 2023-09-19 ENCOUNTER — Encounter: Payer: Self-pay | Admitting: Gastroenterology

## 2023-09-19 ENCOUNTER — Ambulatory Visit: Payer: Medicare HMO | Admitting: Gastroenterology

## 2023-09-19 VITALS — BP 137/73 | HR 63 | Temp 98.5°F | Ht 61.0 in | Wt 128.2 lb

## 2023-09-19 DIAGNOSIS — K642 Third degree hemorrhoids: Secondary | ICD-10-CM

## 2023-09-19 DIAGNOSIS — K641 Second degree hemorrhoids: Secondary | ICD-10-CM

## 2023-09-19 NOTE — Patient Instructions (Signed)
  Please avoid straining.  You should limit your toilet time to 2-3 minutes at the most.   I recommend Benefiber 2 teaspoons each morning in the beverage of your choice!  Please call me with any concerns or issues!  I will see you in follow-up for additional banding in several weeks.   Have a wonderful Christmas!  I enjoyed seeing you again today! I value our relationship and want to provide genuine, compassionate, and quality care. You may receive a survey regarding your visit with me, and I welcome your feedback! Thanks so much for taking the time to complete this. I look forward to seeing you again.      Gelene Mink, PhD, ANP-BC [facility]

## 2023-09-19 NOTE — Progress Notes (Signed)
Left lateral banding and right posterior   Anoscopy with left lateral likely source of recent bleed, right anteiror.       [facility] BANDING PROCEDURE NOTE  Stacy Johnson is a 77 y.o. female presenting today for consideration of hemorrhoid banding. Last colonoscopy  remote past. Not candidate for sedation reportedly due to respiratory status. She has had left lateral banding and right posterior. She notes bleeding recently.     The patient presents with symptomatic grade 2-3 hemorrhoids, unresponsive to maximal medical therapy, requesting rubber band ligation of her hemorrhoidal disease. All risks, benefits, and alternative forms of therapy were described and informed consent was obtained.  In the left lateral decubitus position, anoscopic examination revealed grade 2-3 hemorrhoids in the left lateral and right anterior position (s). Left lateral appeared more engorged and likely site of bleeding.   The decision was made to band the left lateral internal hemorrhoid, and the [facility] O'Regan System was used to perform band ligation without complication. Digital anorectal examination was then performed to assure proper positioning of the band, and to adjust the banded tissue as required. The patient was discharged home without pain or other issues. Dietary and behavioral recommendations were given, along with follow-up instructions. The patient will return in several weeks for followup and possible additional banding as required.  No complications were encountered and the patient tolerated the procedure well.   Gelene Mink, PhD, ANP-BC [facility]astroenter[facility]

## 2023-10-02 DIAGNOSIS — J449 Chronic obstructive pulmonary disease, unspecified: Secondary | ICD-10-CM | POA: Diagnosis not present

## 2023-10-02 DIAGNOSIS — R0902 Hypoxemia: Secondary | ICD-10-CM | POA: Diagnosis not present

## 2023-10-02 DIAGNOSIS — R600 Localized edema: Secondary | ICD-10-CM | POA: Diagnosis not present

## 2023-10-03 ENCOUNTER — Ambulatory Visit (INDEPENDENT_AMBULATORY_CARE_PROVIDER_SITE_OTHER): Payer: Medicare HMO | Admitting: Gastroenterology

## 2023-10-03 ENCOUNTER — Encounter: Payer: Self-pay | Admitting: Gastroenterology

## 2023-10-03 VITALS — BP 147/68 | HR 57 | Temp 97.4°F | Ht 61.0 in | Wt 128.7 lb

## 2023-10-03 DIAGNOSIS — K641 Second degree hemorrhoids: Secondary | ICD-10-CM | POA: Diagnosis not present

## 2023-10-03 NOTE — Progress Notes (Signed)
    [facility] BANDING PROCEDURE NOTE  Stacy Johnson is a 77 y.o. female presenting today for consideration of hemorrhoid banding. Last colonoscopy remote past. Not candidate for sedation reportedly due to respiratory status. She has had left lateral banding X 2and right posterior. No rectal bleeding. Only soiling.    The patient presents with symptomatic grade 2 hemorrhoids, unresponsive to maximal medical therapy, requesting rubber band ligation of her hemorrhoidal disease. All risks, benefits, and alternative forms of therapy were described and informed consent was obtained.   The decision was made to band the right anterior internal hemorrhoid, and the [facility] O'Regan System was used to perform band ligation without complication. Digital anorectal examination was then performed to assure proper positioning of the band, and to adjust the banded tissue as required. The patient was discharged home without pain or other issues. Dietary and behavioral recommendations were given, along with follow-up instructions. The patient will return in several weeks for routine follow-up. Advised to take stool softener once to twice a day.   No complications were encountered and the patient tolerated the procedure well.   Gelene Mink, PhD, ANP-BC [facility] Gastroenterology

## 2023-10-03 NOTE — Patient Instructions (Signed)
  Please avoid straining.  You should limit your toilet time to 2-3 minutes at the most.   I recommend continued fiber daily. Make sure to take the colace once to twice a day if needed!  Please call me with any concerns or issues!  I will see you in follow-up in about 4-6 weeks!  Have a wonderful Christmas!   I enjoyed seeing you again today! I value our relationship and want to provide genuine, compassionate, and quality care. You may receive a survey regarding your visit with me, and I welcome your feedback! Thanks so much for taking the time to complete this. I look forward to seeing you again.      Gelene Mink, PhD, ANP-BC [facility]astroenterology

## 2023-10-10 DIAGNOSIS — J9601 Acute respiratory failure with hypoxia: Secondary | ICD-10-CM | POA: Diagnosis not present

## 2023-10-25 DIAGNOSIS — I5020 Unspecified systolic (congestive) heart failure: Secondary | ICD-10-CM | POA: Diagnosis not present

## 2023-10-25 DIAGNOSIS — J441 Chronic obstructive pulmonary disease with (acute) exacerbation: Secondary | ICD-10-CM | POA: Diagnosis not present

## 2023-10-25 DIAGNOSIS — R030 Elevated blood-pressure reading, without diagnosis of hypertension: Secondary | ICD-10-CM | POA: Diagnosis not present

## 2023-10-25 DIAGNOSIS — Z6824 Body mass index (BMI) 24.0-24.9, adult: Secondary | ICD-10-CM | POA: Diagnosis not present

## 2023-10-25 DIAGNOSIS — I482 Chronic atrial fibrillation, unspecified: Secondary | ICD-10-CM | POA: Diagnosis not present

## 2023-10-29 DIAGNOSIS — E039 Hypothyroidism, unspecified: Secondary | ICD-10-CM | POA: Diagnosis not present

## 2023-10-31 DIAGNOSIS — E039 Hypothyroidism, unspecified: Secondary | ICD-10-CM | POA: Diagnosis not present

## 2023-11-02 DIAGNOSIS — R600 Localized edema: Secondary | ICD-10-CM | POA: Diagnosis not present

## 2023-11-02 DIAGNOSIS — J449 Chronic obstructive pulmonary disease, unspecified: Secondary | ICD-10-CM | POA: Diagnosis not present

## 2023-11-02 DIAGNOSIS — R0902 Hypoxemia: Secondary | ICD-10-CM | POA: Diagnosis not present

## 2023-11-10 DIAGNOSIS — J9601 Acute respiratory failure with hypoxia: Secondary | ICD-10-CM | POA: Diagnosis not present

## 2023-11-12 NOTE — Addendum Note (Signed)
Encounter addended by: Silvana Newness on: 11/12/2023 4:22 PM  Actions taken: Imaging Exam ended

## 2023-11-12 NOTE — Addendum Note (Signed)
Encounter addended by: Silvana Newness on: 11/12/2023 4:21 PM  Actions taken: Imaging Exam ended

## 2023-11-14 ENCOUNTER — Ambulatory Visit: Payer: Medicare HMO | Admitting: Gastroenterology

## 2023-11-14 ENCOUNTER — Encounter: Payer: Self-pay | Admitting: Gastroenterology

## 2023-11-14 VITALS — BP 158/64 | HR 65 | Temp 97.7°F | Ht 61.0 in | Wt 128.0 lb

## 2023-11-14 DIAGNOSIS — K625 Hemorrhage of anus and rectum: Secondary | ICD-10-CM

## 2023-11-14 NOTE — Patient Instructions (Signed)
I am so glad you are doing well!  Continue the fiber supplement daily.  Please call us when you do the Cologuard test, so we can be on the lookout!  I will see you as needed!  I enjoyed seeing you again today! I value our relationship and want to provide genuine, compassionate, and quality care. You may receive a survey regarding your visit with me, and I welcome your feedback! Thanks so much for taking the time to complete this. I look forward to seeing you again.      Gelene Mink, PhD, ANP-BC [facility] Gastroenterology

## 2023-11-14 NOTE — Progress Notes (Signed)
Gastroenterology Office Note     Primary Care Physician:  Juliette Alcide, MD  Primary Gastroenterologist: Dr. Jena Gauss    Chief Complaint   Chief Complaint  Patient presents with   Hemorrhoids    Follow up on hemorrhoids. States she is doing better since bandings. Not having any bleeding. Would like to discuss when she should do cologuard.      History of Present Illness   NONIE LOCHNER is a 78 y.o. female presenting today with a history of internal hemorrhoids s/p banding, colonoscopy in remote past without polyps, not candidate for sedation in past due to respiratory status. Cologuard has been recommended. She has had left lateral banding X 2, right posterior, and right anterior.   Bleeding much improved. No abdominal pain. Takes a fiber supplement in a pill form. Oxygen at night and during day as needed. She tells me she is supposed to use oxygen continuously, but it is cumbersome. No constipation on fiber. Overall, she is improved s/p banding.   Now ready for cologuard as per plan.     Past Medical History:  Diagnosis Date   A-fib (HCC)    Anxiety disorder    Body mass index 26.0-26.9, adult    Breast cancer (HCC) 2005   lumpectomy and xrt   Chronic CHF (congestive heart failure) (HCC)    Chronic obstructive pulmonary disease (COPD) (HCC)    CKD (chronic kidney disease)    Gangrene (HCC)    Hyperlipidemia    Hypertension    Hypothyroid    Localized edema    Pulmonary hypertension (HCC)     Past Surgical History:  Procedure Laterality Date   ABDOMINAL HYSTERECTOMY     BREAST LUMPECTOMY  2005   cataracts Bilateral    COLONOSCOPY     less than 10 years    Current Outpatient Medications  Medication Sig Dispense Refill   albuterol (ACCUNEB) 1.25 MG/3ML nebulizer solution Take 1 ampule by nebulization every 6 (six) hours as needed for wheezing.     albuterol (VENTOLIN HFA) 108 (90 Base) MCG/ACT inhaler SMARTSIG:1-2 Puff(s) By Mouth Every 6 Hours PRN      aspirin EC 81 MG tablet Take 81 mg by mouth daily. Swallow whole.     bisoprolol (ZEBETA) 5 MG tablet Take 1 tablet (5 mg total) by mouth daily. 90 tablet 3   FLUoxetine (PROZAC) 10 MG tablet Take 10 mg by mouth daily.     furosemide (LASIX) 40 MG tablet Take 40 mg by mouth daily as needed.     levothyroxine (SYNTHROID) 75 MCG tablet Take 75 mcg by mouth daily.     losartan (COZAAR) 50 MG tablet Take 1 tablet (50 mg total) by mouth daily. 90 tablet 3   potassium chloride SA (KLOR-CON M) 20 MEQ tablet Take 20 mEq by mouth 2 (two) times daily.     rosuvastatin (CRESTOR) 10 MG tablet Take 10 mg by mouth daily.     TRELEGY ELLIPTA 100-62.5-25 MCG/ACT AEPB Take 1 puff by mouth daily.     PROCTO-MED HC 2.5 % rectal cream SMARTSIG:Topical 2-4 Times Daily (Patient not taking: Reported on 11/14/2023)     No current facility-administered medications for this visit.    Allergies as of 11/14/2023   (No Known Allergies)    Family History  Problem Relation Age of Onset   Colon cancer Neg Hx     Social History   Socioeconomic History   Marital status: Widowed    Spouse name:  Not on file   Number of children: Not on file   Years of education: Not on file   Highest education level: Not on file  Occupational History   Not on file  Tobacco Use   Smoking status: Former    Current packs/day: 0.00    Average packs/day: 1 pack/day for 46.0 years (46.0 ttl pk-yrs)    Types: Cigarettes    Start date: 10/15/1961    Quit date: 10/16/2007    Years since quitting: 16.0   Smokeless tobacco: Never  Vaping Use   Vaping status: Never Used  Substance and Sexual Activity   Alcohol use: Not Currently   Drug use: Never   Sexual activity: Not on file  Other Topics Concern   Not on file  Social History Narrative   Not on file   Social Drivers of Health   Financial Resource Strain: Not on file  Food Insecurity: Not on file  Transportation Needs: Not on file  Physical Activity: Not on file  Stress: Not  on file  Social Connections: Not on file  Intimate Partner Violence: Not on file     Review of Systems   Gen: Denies any fever, chills, fatigue, weight loss, lack of appetite.  CV: Denies chest pain, heart palpitations, peripheral edema, syncope.  Resp: Denies shortness of breath at rest or with exertion. Denies wheezing or cough.  GI: Denies dysphagia or odynophagia. Denies jaundice, hematemesis, fecal incontinence. GU : Denies urinary burning, urinary frequency, urinary hesitancy MS: Denies joint pain, muscle weakness, cramps, or limitation of movement.  Derm: Denies rash, itching, dry skin Psych: Denies depression, anxiety, memory loss, and confusion Heme: Denies bruising, bleeding, and enlarged lymph nodes.   Physical Exam   BP (!) 161/68   Pulse 65   Temp 97.7 F (36.5 C) (Oral)   Ht 5\' 1"  (1.549 m)   Wt 128 lb (58.1 kg)   BMI 24.19 kg/m  General:   Alert and oriented. Pleasant and cooperative. Well-nourished and well-developed.  Head:  Normocephalic and atraumatic. Eyes:  Without icterus Abdomen:  +BS, soft, non-tender and non-distended. No HSM noted. No guarding or rebound. No masses appreciated.  Rectal:  Deferred  Msk:  Symmetrical without gross deformities. Normal posture. Extremities:  Without edema. Neurologic:  Alert and  oriented x4;  grossly normal neurologically. Skin:  Intact without significant lesions or rashes. Psych:  Alert and cooperative. Normal mood and affect.   Assessment   LENNAN MALONE is a 78 y.o. female presenting today with a history of rectal bleeding most likely secondary to internal hemorrhoids s/p banding of all columns recently.   She has been deemed high risk for anesthesia per Pulmonology; therefore, we have pursued conservative measures. Cologuard previously recommended following banding. She will complete this per plan and already has this at her home.   If negative, will see her prn.   PLAN    Cologuard as planned Further  recommendations to follow  Return prn for banding    Gelene Mink, PhD, [facility] Gastroenterology

## 2023-11-28 ENCOUNTER — Ambulatory Visit: Payer: Medicare HMO | Admitting: Internal Medicine

## 2023-12-03 ENCOUNTER — Other Ambulatory Visit: Payer: Self-pay

## 2023-12-03 DIAGNOSIS — R600 Localized edema: Secondary | ICD-10-CM | POA: Diagnosis not present

## 2023-12-03 DIAGNOSIS — R0902 Hypoxemia: Secondary | ICD-10-CM | POA: Diagnosis not present

## 2023-12-03 DIAGNOSIS — J449 Chronic obstructive pulmonary disease, unspecified: Secondary | ICD-10-CM | POA: Diagnosis not present

## 2023-12-03 MED ORDER — BISOPROLOL FUMARATE 5 MG PO TABS: 5.0000 mg | ORAL_TABLET | Freq: Every day | ORAL | 3 refills | Status: AC

## 2023-12-11 DIAGNOSIS — J9601 Acute respiratory failure with hypoxia: Secondary | ICD-10-CM | POA: Diagnosis not present

## 2023-12-26 ENCOUNTER — Encounter: Payer: Self-pay | Admitting: Internal Medicine

## 2023-12-26 ENCOUNTER — Ambulatory Visit: Payer: Medicare HMO | Attending: Internal Medicine | Admitting: Internal Medicine

## 2023-12-26 VITALS — BP 150/82 | HR 58 | Ht 61.0 in | Wt 130.6 lb

## 2023-12-26 DIAGNOSIS — I480 Paroxysmal atrial fibrillation: Secondary | ICD-10-CM

## 2023-12-26 DIAGNOSIS — I10 Essential (primary) hypertension: Secondary | ICD-10-CM | POA: Diagnosis not present

## 2023-12-26 MED ORDER — LOSARTAN POTASSIUM 100 MG PO TABS: 100.0000 mg | ORAL_TABLET | Freq: Every day | ORAL | 3 refills | Status: AC

## 2023-12-26 NOTE — Progress Notes (Signed)
 Cardiology Office Note  Date: 12/26/2023   ID: KHADEEJAH CASTNER, DOB 09/26/1946, MRN [id number]  PCP:  Juliette Alcide, MD  Cardiologist:  Marjo Bicker, MD Electrophysiologist:  None    History of Present Illness: Stacy Johnson is a 78 y.o. female known to have tachycardia induced cardiomyopathy with LVEF 20% improved to 55% in 2023 with new drop in LVEF 45% in 3/24, paroxysmal atrial fibrillation (refused to be on [facility]), HTN, PAD, anxiety is here for follow-up visit.  Patient was admitted to [facility] 06/03/2021 with A-fib with RVR and new onset cardiomyopathy LVEF 20%. After A-fib rates are controlled, she was placed on amiodarone and discharged. Repeat echocardiogram in 2023 showed normal LVEF.  She has been seen by pulmonology who recommended echocardiogram that showed LVEF 45 to 50%.  She was having hypothyroidism symptoms due to long-term amiodarone use which was started in 2022 at [facility]. Amiodarone was discontinued and she followed up with PCP for management of hypothyroidism.  She is here for follow-up visit.    Continues to have SOB but no recent worsening.  No angina, dizziness, presyncope, syncope, leg swelling.  No palpitations.  She refused stress testing.  Home blood pressures range around 150 mmHg SBP.  Her sister passed away around few weeks ago.  But her blood pressures were elevated before this incident.  Patient underwent ABI in 2021 that showed R resting ABI 0.67 and postexercise ABI 0.39, L resting ABI 0.58 and postexercise ABI 0.43.  She was seen by Dr. Arbie Cookey in 2021, did not have any claudication symptoms and was told to return back as needed basis for any progression of claudication symptoms.  She had intermittent claudication symptoms previously but not anymore.  She is not walking much either.  Past Medical History:  Diagnosis Date   A-fib (HCC)    Anxiety disorder    Body mass index 26.0-26.9, adult    Breast cancer (HCC) 2005   lumpectomy and xrt   Chronic CHF  (congestive heart failure) (HCC)    Chronic obstructive pulmonary disease (COPD) (HCC)    CKD (chronic kidney disease)    Gangrene (HCC)    Hyperlipidemia    Hypertension    Hypothyroid    Localized edema    Pulmonary hypertension (HCC)     Past Surgical History:  Procedure Laterality Date   ABDOMINAL HYSTERECTOMY     BREAST LUMPECTOMY  2005   cataracts Bilateral    COLONOSCOPY     less than 10 years    Current Outpatient Medications  Medication Sig Dispense Refill   albuterol (ACCUNEB) 1.25 MG/3ML nebulizer solution Take 1 ampule by nebulization every 6 (six) hours as needed for wheezing.     albuterol (VENTOLIN HFA) 108 (90 Base) MCG/ACT inhaler SMARTSIG:1-2 Puff(s) By Mouth Every 6 Hours PRN     aspirin EC 81 MG tablet Take 81 mg by mouth daily. Swallow whole.     bisoprolol (ZEBETA) 5 MG tablet Take 1 tablet (5 mg total) by mouth daily. 90 tablet 3   FLUoxetine (PROZAC) 10 MG tablet Take 10 mg by mouth daily.     furosemide (LASIX) 40 MG tablet Take 40 mg by mouth daily as needed.     levothyroxine (SYNTHROID) 75 MCG tablet Take 75 mcg by mouth daily.     losartan (COZAAR) 50 MG tablet Take 1 tablet (50 mg total) by mouth daily. 90 tablet 3   potassium chloride SA (KLOR-CON M) 20 MEQ tablet Take 20 mEq  by mouth 2 (two) times daily.     rosuvastatin (CRESTOR) 10 MG tablet Take 10 mg by mouth daily.     TRELEGY ELLIPTA 100-62.5-25 MCG/ACT AEPB Take 1 puff by mouth daily.     No current facility-administered medications for this visit.   Allergies:  Patient has no known allergies.   Social History: The patient  reports that she quit smoking about 16 years ago. Her smoking use included cigarettes. She started smoking about 62 years ago. She has a 46 pack-year smoking history. She has never used smokeless tobacco. She reports that she does not currently use alcohol. She reports that she does not use drugs.   Family History: The patient's family history is not on file.   ROS:   Please see the history of present illness. Otherwise, complete review of systems is positive for none.  All other systems are reviewed and negative.   Physical Exam: VS:  BP (!) 150/82 (Cuff Size: Normal)   Pulse (!) 58   Ht 5\' 1"  (1.549 m)   Wt 130 lb 9.6 oz (59.2 kg)   SpO2 94%   BMI 24.68 kg/m , BMI Body mass index is 24.68 kg/m.  Wt Readings from Last 3 Encounters:  12/26/23 130 lb 9.6 oz (59.2 kg)  11/14/23 128 lb (58.1 kg)  10/03/23 128 lb 11.2 oz (58.4 kg)    General: Patient appears comfortable at rest. HEENT: Conjunctiva and lids normal, oropharynx clear with moist mucosa. Neck: Supple, no elevated JVP or carotid bruits, no thyromegaly. Lungs: Clear to auscultation, nonlabored breathing at rest. Cardiac: Regular rate and rhythm, no S3 or significant systolic murmur, no pericardial rub. Abdomen: Soft, nontender, no hepatomegaly, bowel sounds present, no guarding or rebound. Extremities: No pitting edema, distal pulses 2+. Skin: Warm and dry. Musculoskeletal: No kyphosis. Neuropsychiatric: Alert and oriented x3, affect grossly appropriate.  Recent Labwork: 02/13/2023: TSH 6.989  No results found for: "CHOL", "TRIG", "HDL", "CHOLHDL", "VLDL", "LDLCALC", "LDLDIRECT"  Other Studies Reviewed Today: Echocardiogram in 12/2022 LVEF 45 to 50% G2 DD RV systolic function is normal LA severely dilated RA severely dilated Mild MR Mild AR  Echocardiogram in 2023 LVEF normal  Echocardiogram in 2022 LVEF 20%  Assessment and Plan:  # New onset cardiomyopathy LVEF 45 to 50% # HFimpEF (20% improved to 55% in 2023, tachycardia induced) -Patient had tachycardia induced cardiomyopathy with LVEF 20% in 2022 in the setting of atrial fibrillation with RVR. Repeat echo in 2023 showed normal LVEF after A-fib rates were controlled.  Pulmonology performed echo in 3/24 due to pulmonary hypertension and her LVEF was noted to be 45 to 50% with mild elevation of PASP.  Patient refused stress  testing for ischemia evaluation.  Will optimize GDMT and repeat echocardiogram.  Continue bisoprolol 5 mg once daily, increase losartan from 50 mg to 100 mg once daily and repeat echocardiogram in 6 months.  If there is any worsening of her SOB, instructed her to call the clinic so we can obtain CT cardiac.  # Paroxysmal A-fib (EKG today showed sinus bradycardia, HR 56 bpm) -Continue bisoprolol 5 mg once daily. Refused systemic AC due to cost issues.  Refused Coumadin due to frequent INR checks.  # Moderate to severe PAD in 2021: Patient underwent ABI in 2021 that showed R resting ABI 0.67 and postexercise ABI 0.39, L resting ABI 0.58 and postexercise ABI 0.43.  She was seen by Dr. Arbie Cookey in 2021, did not have any claudication symptoms and was told to return  back as needed basis for any progression of claudication symptoms.  Previously had claudication symptoms but no interval claudication as she is not walking much due to winter.  She previously refused supervised exercise therapy for PAD.  Continue aspirin 81 mg once daily, rosuvastatin 10 mg at bedtime.  # HTN, poorly controlled -Increase losartan from 50 mg to 100 mg once daily and continue bisoprolol 5 mg once daily.  Home BPs range around 140 to 150 mmHg SBP.   Medication Adjustments/Labs and Tests Ordered: Current medicines are reviewed at length with the patient today.  Concerns regarding medicines are outlined above.   Tests Ordered: Orders Placed This Encounter  Procedures   EKG 12-Lead    Medication Changes: No orders of the defined types were placed in this encounter.   Disposition:  Follow up 10 months  Signed, Miloh Alcocer Verne Spurr, MD, 12/26/2023 10:15 AM    [facility] at [facility] [address], [place], [place] [address]

## 2023-12-26 NOTE — Patient Instructions (Signed)
 Medication Instructions:  Your physician has recommended you make the following change in your medication:   -Increase Losartan to 100 mg tablet once daily  *If you need a refill on your cardiac medications before your next appointment, please call your pharmacy*   Lab Work: None If you have labs (blood work) drawn today and your tests are completely normal, you will receive your results only by: MyChart Message (if you have MyChart) OR A paper copy in the mail If you have any lab test that is abnormal or we need to change your treatment, we will call you to review the results.   Testing/Procedures: IN 6 MONTHS:  Your physician has requested that you have an echocardiogram. Echocardiography is a painless test that uses sound waves to create images of your heart. It provides your doctor with information about the size and shape of your heart and how well your heart's chambers and valves are working. This procedure takes approximately one hour. There are no restrictions for this procedure. Please do NOT wear cologne, perfume, aftershave, or lotions (deodorant is allowed). Please arrive 15 minutes prior to your appointment time.  Please note: We ask at that you not bring children with you during ultrasound (echo/ vascular) testing. Due to room size and safety concerns, children are not allowed in the ultrasound rooms during exams. Our front office staff cannot provide observation of children in our lobby area while testing is being conducted. An adult accompanying a patient to their appointment will only be allowed in the ultrasound room at the discretion of the ultrasound technician under special circumstances. We apologize for any inconvenience.    Follow-Up: At [facility], you and your health needs are our priority.  As part of our continuing mission to provide you with exceptional heart care, we have created designated Provider Care Teams.  These Care Teams include your  primary Cardiologist (physician) and Advanced Practice Providers (APPs -  Physician Assistants and Nurse Practitioners) who all work together to provide you with the care you need, when you need it.  We recommend signing up for the patient portal called "MyChart".  Sign up information is provided on this After Visit Summary.  MyChart is used to connect with patients for Virtual Visits (Telemedicine).  Patients are able to view lab/test results, encounter notes, upcoming appointments, etc.  Non-urgent messages can be sent to your provider as well.   To learn more about what you can do with MyChart, go to [email or web address].    Your next appointment:   10 month(s)  Provider:   You may see Vishnu P Mallipeddi, MD or one of the following Advanced Practice Providers on your designated Care Team:   [place], PA-C  Jacolyn Reedy, [place]     Other Instructions

## 2023-12-31 DIAGNOSIS — R600 Localized edema: Secondary | ICD-10-CM | POA: Diagnosis not present

## 2023-12-31 DIAGNOSIS — R0902 Hypoxemia: Secondary | ICD-10-CM | POA: Diagnosis not present

## 2023-12-31 DIAGNOSIS — J449 Chronic obstructive pulmonary disease, unspecified: Secondary | ICD-10-CM | POA: Diagnosis not present

## 2024-01-08 DIAGNOSIS — J9601 Acute respiratory failure with hypoxia: Secondary | ICD-10-CM | POA: Diagnosis not present

## 2024-01-09 ENCOUNTER — Telehealth (HOSPITAL_BASED_OUTPATIENT_CLINIC_OR_DEPARTMENT_OTHER): Payer: Self-pay | Admitting: Pulmonary Disease

## 2024-01-09 DIAGNOSIS — R911 Solitary pulmonary nodule: Secondary | ICD-10-CM

## 2024-01-09 NOTE — Telephone Encounter (Signed)
 Copied from CRM [id number]. Topic: General - Other >> Jan 09, 2024  2:43 PM Isabell A wrote: Reason for CRM: Patient would like to confirm if she needs to have a CT scan before her follow up on 04/16/24.  Callback number: [phone]   Last office visits states she is needing a CT scan prior to her visit with RA. No order is shown please advise and place order if necessary

## 2024-01-09 NOTE — Telephone Encounter (Signed)
 Is it okay to Order CT

## 2024-01-10 NOTE — Telephone Encounter (Signed)
 You will have to order as I was informed [facility] are no longer allowed to order, Thanks

## 2024-01-21 ENCOUNTER — Telehealth (HOSPITAL_BASED_OUTPATIENT_CLINIC_OR_DEPARTMENT_OTHER): Payer: Self-pay

## 2024-01-21 NOTE — Telephone Encounter (Signed)
 Pt notified on identifiable VM that she can call to schedule her CT as this has been ordered I left her the number for scheduling at [phone] to make her appt.   Copied from CRM [id number]. Topic: Clinical - Request for Lab/Test Order >> Jan 21, 2024 12:54 PM [name] wrote: Reason for CRM: Patient is requesting her CT scan to be scheduled prior to her follow up appointment with Dr. Vassie Loll so that they may discuss the results together at the appointment. Please call patient when this is done to confirm date/ time of the CT.

## 2024-01-29 DIAGNOSIS — I739 Peripheral vascular disease, unspecified: Secondary | ICD-10-CM | POA: Diagnosis not present

## 2024-01-29 DIAGNOSIS — J449 Chronic obstructive pulmonary disease, unspecified: Secondary | ICD-10-CM | POA: Diagnosis not present

## 2024-01-29 DIAGNOSIS — I5020 Unspecified systolic (congestive) heart failure: Secondary | ICD-10-CM | POA: Diagnosis not present

## 2024-01-29 DIAGNOSIS — I429 Cardiomyopathy, unspecified: Secondary | ICD-10-CM | POA: Diagnosis not present

## 2024-01-29 DIAGNOSIS — Z6824 Body mass index (BMI) 24.0-24.9, adult: Secondary | ICD-10-CM | POA: Diagnosis not present

## 2024-01-31 DIAGNOSIS — J449 Chronic obstructive pulmonary disease, unspecified: Secondary | ICD-10-CM | POA: Diagnosis not present

## 2024-01-31 DIAGNOSIS — R600 Localized edema: Secondary | ICD-10-CM | POA: Diagnosis not present

## 2024-01-31 DIAGNOSIS — R0902 Hypoxemia: Secondary | ICD-10-CM | POA: Diagnosis not present

## 2024-02-08 DIAGNOSIS — J9601 Acute respiratory failure with hypoxia: Secondary | ICD-10-CM | POA: Diagnosis not present

## 2024-03-01 DIAGNOSIS — R0902 Hypoxemia: Secondary | ICD-10-CM | POA: Diagnosis not present

## 2024-03-01 DIAGNOSIS — J449 Chronic obstructive pulmonary disease, unspecified: Secondary | ICD-10-CM | POA: Diagnosis not present

## 2024-03-01 DIAGNOSIS — R600 Localized edema: Secondary | ICD-10-CM | POA: Diagnosis not present

## 2024-03-09 DIAGNOSIS — J9601 Acute respiratory failure with hypoxia: Secondary | ICD-10-CM | POA: Diagnosis not present

## 2024-03-24 DIAGNOSIS — E039 Hypothyroidism, unspecified: Secondary | ICD-10-CM | POA: Diagnosis not present

## 2024-03-31 DIAGNOSIS — Z6824 Body mass index (BMI) 24.0-24.9, adult: Secondary | ICD-10-CM | POA: Diagnosis not present

## 2024-03-31 DIAGNOSIS — E1169 Type 2 diabetes mellitus with other specified complication: Secondary | ICD-10-CM | POA: Diagnosis not present

## 2024-03-31 DIAGNOSIS — F339 Major depressive disorder, recurrent, unspecified: Secondary | ICD-10-CM | POA: Diagnosis not present

## 2024-03-31 DIAGNOSIS — J441 Chronic obstructive pulmonary disease with (acute) exacerbation: Secondary | ICD-10-CM | POA: Diagnosis not present

## 2024-03-31 DIAGNOSIS — F4381 Prolonged grief disorder: Secondary | ICD-10-CM | POA: Diagnosis not present

## 2024-04-01 DIAGNOSIS — R600 Localized edema: Secondary | ICD-10-CM | POA: Diagnosis not present

## 2024-04-01 DIAGNOSIS — R0902 Hypoxemia: Secondary | ICD-10-CM | POA: Diagnosis not present

## 2024-04-01 DIAGNOSIS — J449 Chronic obstructive pulmonary disease, unspecified: Secondary | ICD-10-CM | POA: Diagnosis not present

## 2024-04-09 ENCOUNTER — Ambulatory Visit (HOSPITAL_COMMUNITY)
Admission: RE | Admit: 2024-04-09 | Discharge: 2024-04-09 | Disposition: A | Discharge status: Home / Self Care | Source: Ambulatory Visit | Attending: Pulmonary Disease | Admitting: Pulmonary Disease

## 2024-04-09 DIAGNOSIS — R911 Solitary pulmonary nodule: Secondary | ICD-10-CM | POA: Insufficient documentation

## 2024-04-09 DIAGNOSIS — J432 Centrilobular emphysema: Secondary | ICD-10-CM | POA: Diagnosis not present

## 2024-04-09 DIAGNOSIS — J9601 Acute respiratory failure with hypoxia: Secondary | ICD-10-CM | POA: Diagnosis not present

## 2024-04-09 DIAGNOSIS — I700 Atherosclerosis of aorta: Secondary | ICD-10-CM | POA: Diagnosis not present

## 2024-04-16 ENCOUNTER — Ambulatory Visit (HOSPITAL_BASED_OUTPATIENT_CLINIC_OR_DEPARTMENT_OTHER): Admitting: Pulmonary Disease

## 2024-04-16 ENCOUNTER — Encounter (HOSPITAL_BASED_OUTPATIENT_CLINIC_OR_DEPARTMENT_OTHER): Payer: Self-pay | Admitting: Pulmonary Disease

## 2024-04-16 VITALS — BP 142/65 | Ht 61.0 in | Wt 127.0 lb

## 2024-04-16 DIAGNOSIS — J441 Chronic obstructive pulmonary disease with (acute) exacerbation: Secondary | ICD-10-CM | POA: Diagnosis not present

## 2024-04-16 DIAGNOSIS — J439 Emphysema, unspecified: Secondary | ICD-10-CM | POA: Diagnosis not present

## 2024-04-16 MED ORDER — PREDNISONE 10 MG PO TABS: ORAL_TABLET | ORAL | 0 refills | Status: DC

## 2024-04-16 NOTE — Progress Notes (Signed)
 Subjective:    Patient ID: Stacy Johnson, female    DOB: 1945/12/31, 78 y.o.   MRN: [id number]  HPI  78 yo remote smoker for FU of COPD, chronic hypoxic respiratory failure and lung nodules. She has moderate to severe pulmonary hypertension,likely WHO 2/3  -has Inogen POC    She smoked more than 40 pack years up to 2 packs/day before she quit in 2004.  She was diagnosed with COPD in [facility], [place]  by pulmonologist Dr. Leah . She reports pulmonary nodules that were stable on follow-up, she refused biopsy  PET 06/2023 Left lower lobe bandlike nodular thickening appears less spiculated and stretched out more like scarring with low SUV of 3.6.  We repeated CT chest without contrast 07/30/2023 which shows left lower lobe lesion is smaller with air bronchograms?  Resolving inflammation.  Left upper lobe 12 mm nodule is stable and dates back to 2018     PMH -  HFrEF -LVEF 20% improved to 55% in 2023  Atrial fibrillation RVR, stopped Eliquis does not want Coumadin, does not want cards referral Right breast cancer s/p lumpectomy and RT 2005 CKD stage III hypothyroidism due to long-term amiodarone use   60-month follow-up visit Her younger sister recently died and she is depressed, Prozac does not help. Trelegy has been switched to Breztri. She does not use her portable oxygen  all the time and arrived without oxygen  on running at 86% on room air, improved after turning on her POC and was turned down to 4 L We reviewed her recent  CT chest results  Significant tests/ events reviewed   PFTs 02/2023 ratio 33, FEV1 32%, FVC 74%, TLC 140%, DLCO 3.56/22%   11/09/22 On ambulation saturation dropped from 90% to 84% and required 3 L of oxygen  to recover and maintain while walking.    06/2023 CT chest >>  enlarged paraspinal left lower lobe pulmonary nodule, now 2.6 x 1.4 cm, other 2 nodules are stable, new T5 compression fracture     03/2024 Ct chest wo con >> resolving LLL  nodule, stable LUL nodule 11/2022 CT chest without con>> left upper lobe subpleural nodule slightly increased in size to 11 x 8 mm, previously 9 x 6 mm, new left lower lobe nodule 5 mm, new groundglass opacity right lower lobe with right upper paratracheal lymph node subcentimeter   02/2018 CT chest >> severe apical bullous emphysema, peripheral left upper lobe pulmonary nodule   02/15/2021 Walked 2lpm approx 100 ft @ moderate pace stopped due to pulse 170 No sob with sats still 98% Vs sats 86% RA (clinically 02 dep since 11/2020      Echo 12/2022 EF 45 to 50%, global hypokinesis, RVSP 38   Echo 12/2021 grade 2 diastolic dysfunction, EF 55 to 60%, RVSP 58 , moderate TR, moderate to severe pulm hypertension  Review of Systems neg for any significant sore throat, dysphagia, itching, sneezing, nasal congestion or excess/ purulent secretions, fever, chills, sweats, unintended wt loss, pleuritic or exertional cp, hempoptysis, orthopnea pnd or change in chronic leg swelling. Also denies presyncope, palpitations, heartburn, abdominal pain, nausea, vomiting, diarrhea or change in bowel or urinary habits, dysuria,hematuria, rash, arthralgias, visual complaints, headache, numbness weakness or ataxia.     Objective:   Physical Exam   Gen. Pleasant, well-nourished, in no distress ENT - no thrush, no pallor/icterus,no post nasal drip Neck: No JVD, no thyromegaly, no carotid bruits Lungs: no use of accessory muscles, no dullness to percussion,decreased BL without  rales or rhonchi  Cardiovascular: Rhythm regular, heart sounds  normal, no murmurs or gallops, no peripheral edema Musculoskeletal: No deformities, no cyanosis or clubbing         Assessment & Plan:    COPD without exacerbation Chronic respiratory failure with hypoxia  - Emphasize use of oxygen  during exertion. -Continue Breztri twice daily and albuterol  for rescue.  Discussed plan of action for COPD exacerbation   Pulmonary nodules-left  lower lobe nodule is now resolved and left upper lobe nodule appears benign and stable.  Continue annual low-dose CT screening  Grief reaction -counseled, she will follow-up with PCP

## 2024-04-16 NOTE — Patient Instructions (Signed)
 CT chest shows resolved nodule  X Prednisone 10 mg tabs Take 4 tabs  daily with food x 4 days, then 3 tabs daily x 4 days, then 2 tabs daily x 4 days, then 1 tab daily x4 days then stop. #40

## 2024-05-01 DIAGNOSIS — R600 Localized edema: Secondary | ICD-10-CM | POA: Diagnosis not present

## 2024-05-01 DIAGNOSIS — R0902 Hypoxemia: Secondary | ICD-10-CM | POA: Diagnosis not present

## 2024-05-01 DIAGNOSIS — J449 Chronic obstructive pulmonary disease, unspecified: Secondary | ICD-10-CM | POA: Diagnosis not present

## 2024-05-09 DIAGNOSIS — J9601 Acute respiratory failure with hypoxia: Secondary | ICD-10-CM | POA: Diagnosis not present

## 2024-06-01 DIAGNOSIS — J449 Chronic obstructive pulmonary disease, unspecified: Secondary | ICD-10-CM | POA: Diagnosis not present

## 2024-06-01 DIAGNOSIS — R0902 Hypoxemia: Secondary | ICD-10-CM | POA: Diagnosis not present

## 2024-06-01 DIAGNOSIS — R600 Localized edema: Secondary | ICD-10-CM | POA: Diagnosis not present

## 2024-06-09 DIAGNOSIS — J9601 Acute respiratory failure with hypoxia: Secondary | ICD-10-CM | POA: Diagnosis not present

## 2024-06-29 ENCOUNTER — Ambulatory Visit (HOSPITAL_COMMUNITY)

## 2024-07-02 DIAGNOSIS — J449 Chronic obstructive pulmonary disease, unspecified: Secondary | ICD-10-CM | POA: Diagnosis not present

## 2024-07-02 DIAGNOSIS — R600 Localized edema: Secondary | ICD-10-CM | POA: Diagnosis not present

## 2024-07-02 DIAGNOSIS — R0902 Hypoxemia: Secondary | ICD-10-CM | POA: Diagnosis not present

## 2024-07-07 ENCOUNTER — Telehealth: Payer: Self-pay

## 2024-07-07 NOTE — Telephone Encounter (Signed)
 Spoke with Dr Lari at [facility] pts PCP. He wanted to let us  know that pt is wearing her POC, is very tight and restricted. She is using her Albuterol  multiple times and is still SOB with exertion. He states he feels she is still suffering with significant pulmonary issues. We did verify her appt on 07/21/2024 for follow-up he thought appropriate for that F/U

## 2024-07-09 DIAGNOSIS — R0602 Shortness of breath: Secondary | ICD-10-CM | POA: Diagnosis not present

## 2024-07-09 DIAGNOSIS — J441 Chronic obstructive pulmonary disease with (acute) exacerbation: Secondary | ICD-10-CM | POA: Diagnosis not present

## 2024-07-10 DIAGNOSIS — J9601 Acute respiratory failure with hypoxia: Secondary | ICD-10-CM | POA: Diagnosis not present

## 2024-07-14 DIAGNOSIS — J439 Emphysema, unspecified: Secondary | ICD-10-CM | POA: Diagnosis not present

## 2024-07-14 DIAGNOSIS — Z1231 Encounter for screening mammogram for malignant neoplasm of breast: Secondary | ICD-10-CM | POA: Diagnosis not present

## 2024-07-14 DIAGNOSIS — I5020 Unspecified systolic (congestive) heart failure: Secondary | ICD-10-CM | POA: Diagnosis not present

## 2024-07-14 DIAGNOSIS — E782 Mixed hyperlipidemia: Secondary | ICD-10-CM | POA: Diagnosis not present

## 2024-07-21 ENCOUNTER — Ambulatory Visit: Admitting: Adult Health

## 2024-07-21 ENCOUNTER — Encounter: Payer: Self-pay | Admitting: Adult Health

## 2024-07-21 VITALS — BP 152/82 | HR 63 | Temp 98.6°F | Ht 60.0 in | Wt 126.4 lb

## 2024-07-21 DIAGNOSIS — I509 Heart failure, unspecified: Secondary | ICD-10-CM

## 2024-07-21 DIAGNOSIS — J441 Chronic obstructive pulmonary disease with (acute) exacerbation: Secondary | ICD-10-CM | POA: Diagnosis not present

## 2024-07-21 DIAGNOSIS — I429 Cardiomyopathy, unspecified: Secondary | ICD-10-CM | POA: Diagnosis not present

## 2024-07-21 DIAGNOSIS — R911 Solitary pulmonary nodule: Secondary | ICD-10-CM

## 2024-07-21 DIAGNOSIS — J9611 Chronic respiratory failure with hypoxia: Secondary | ICD-10-CM

## 2024-07-21 DIAGNOSIS — Z87891 Personal history of nicotine dependence: Secondary | ICD-10-CM

## 2024-07-21 DIAGNOSIS — I5022 Chronic systolic (congestive) heart failure: Secondary | ICD-10-CM

## 2024-07-21 MED ORDER — BREZTRI AEROSPHERE 160-9-4.8 MCG/ACT IN AERO: 2.0000 | INHALATION_SPRAY | Freq: Two times a day (BID) | RESPIRATORY_TRACT | Status: AC

## 2024-07-21 MED ORDER — AMOXICILLIN-POT CLAVULANATE 875-125 MG PO TABS: 1.0000 | ORAL_TABLET | Freq: Two times a day (BID) | ORAL | 0 refills | Status: DC

## 2024-07-21 MED ORDER — OHTUVAYRE 3 MG/2.5ML IN SUSP: 1.0000 | Freq: Two times a day (BID) | RESPIRATORY_TRACT | 11 refills | Status: AC

## 2024-07-21 MED ORDER — PREDNISONE 5 MG PO TABS: 5.0000 mg | ORAL_TABLET | Freq: Every day | ORAL | 2 refills | Status: DC

## 2024-07-21 NOTE — Patient Instructions (Addendum)
 Begin Ohtuvayre  neb Twice daily Taper prednisone  5mg  daily -hold at this dose.  Continue on Breztri 2 puffs Twice daily, rinse after use.  Albuterol  inhaler or neb As needed   Continue on Oxygen  4l/m.  Follow up with Dr. Jude  or Akeylah Hendel NP in 4-6 weeks or As needed   Please contact office for sooner follow up if symptoms do not improve or worsen or seek emergency care

## 2024-07-21 NOTE — Progress Notes (Signed)
 @Patient  ID: Stacy Johnson, female    DOB: [birth year], 78 y.o.   MRN: [id number]  Chief Complaint  Patient presents with   Follow-up    COPD     Referring provider: Lari Elspeth BRAVO, MD  HPI: 78 year old female former smoker followed for COPD, lung nodules and chronic respiratory failure on oxygen  History of moderate to severe pulmonary hypertension likely WHO group 2 and 3 Atrial fibrillation, right breast cancer, chronic kidney disease, heart failure    TEST/EVENTS :  PET 06/2023 Left lower lobe bandlike nodular thickening appears less spiculated and stretched out more like scarring with low SUV of 3.6.  repeated CT chest without contrast 07/30/2023 which shows left lower lobe lesion is smaller with air bronchograms?  Resolving inflammation.  Left upper lobe 12 mm nodule is stable and dates back to 2018  PFTs 02/2023 ratio 33, FEV1 32%, FVC 74%, TLC 140%, DLCO 3.56/22%   11/09/22 On ambulation saturation dropped from 90% to 84% and required 3 L of oxygen  to recover and maintain while walking.    06/2023 CT chest >>  enlarged paraspinal left lower lobe pulmonary nodule, now 2.6 x 1.4 cm, other 2 nodules are stable, new T5 compression fracture     03/2024 Ct chest wo con >> resolving LLL nodule, stable LUL nodule  11/2022 CT chest without con>> left upper lobe subpleural nodule slightly increased in size to 11 x 8 mm, previously 9 x 6 mm, new left lower lobe nodule 5 mm, new groundglass opacity right lower lobe with right upper paratracheal lymph node subcentimeter   02/2018 CT chest >> severe apical bullous emphysema, peripheral left upper lobe pulmonary nodule   02/15/2021 Walked 2lpm approx 100 ft @ moderate pace stopped due to pulse 170 No sob with sats still 98% Vs sats 86% RA (clinically 02 dep since 11/2020      Echo 12/2022 EF 45 to 50%, global hypokinesis, RVSP 38   Echo 12/2021 grade 2 diastolic dysfunction, EF 55 to 60%, RVSP 58 , moderate TR, moderate to severe pulm  hypertension   Discussed the use of AI scribe software for clinical note transcription with the patient, who gave verbal consent to proceed.  History of Present Illness Stacy Johnson is a 78 year old female with COPD and congestive heart failure who presents with worsening shortness of breath.  She has been experiencing worsening shortness of breath, which has been somewhat alleviated by prednisone . She has been on prednisone  for two weeks and has three doses remaining. Without prednisone , she struggles to sit comfortably. She experiences significant shortness of breath, making it difficult to move between rooms without becoming breathless. She uses four liters of oxygen . No hemoptysis, but she reports congestion and coughing.  with recent CT scans showing resolving lung nodules in the left lung, which were initially suspected to be cancerous but the patient recalls being told by Dr. Jude that she had gotten smaller and were not cancer. The nodules have left some scarring. Her current medications include Breztri, taken two puffs in the morning and evening, and a nebulizer with albuterol . She has not been on antibiotics recently, only prednisone .  She has a history of congestive heart failure, cardiomyopathy, and atrial fibrillation. She was previously on Lasix but is not currently taking it. Her medications include bisoprolol  and Cozaar . She recalls significant weight gain due to fluid retention in the past, managed with Lasix. Currently, she is not experiencing leg swelling.  Her family history includes COPD,  as her sister, who lived with her, passed away from the condition in March. This loss has been a significant source of stress for her, as she describes her sister as being like a daughter to her.     No Known Allergies  There is no immunization history for the selected administration types on file for this patient.  Past Medical History:  Diagnosis Date   A-fib (HCC)    Anxiety  disorder    Body mass index 26.0-26.9, adult    Breast cancer (HCC) 2005   lumpectomy and xrt   Chronic CHF (congestive heart failure) (HCC)    Chronic obstructive pulmonary disease (COPD) (HCC)    CKD (chronic kidney disease)    Gangrene (HCC)    Hyperlipidemia    Hypertension    Hypothyroid    Localized edema    Pulmonary hypertension (HCC)     Tobacco History: Social History   Tobacco Use  Smoking Status Former   Current packs/day: 0.00   Average packs/day: 1 pack/day for 46.0 years (46.0 ttl pk-yrs)   Types: Cigarettes   Start date: 10/15/1961   Quit date: 10/16/2007   Years since quitting: 16.7  Smokeless Tobacco Never   Counseling given: Not Answered   Outpatient Medications Prior to Visit  Medication Sig Dispense Refill   albuterol  (ACCUNEB ) 1.25 MG/3ML nebulizer solution Take 1 ampule by nebulization every 6 (six) hours as needed for wheezing.     albuterol  (VENTOLIN  HFA) 108 (90 Base) MCG/ACT inhaler SMARTSIG:1-2 Puff(s) By Mouth Every 6 Hours PRN     aspirin EC 81 MG tablet Take 81 mg by mouth daily. Swallow whole.     budesonide-glycopyrrolate-formoterol (BREZTRI AEROSPHERE) 160-9-4.8 MCG/ACT AERO inhaler Inhale 2 puffs into the lungs 2 (two) times daily.     FLUoxetine (PROZAC) 10 MG tablet Take 10 mg by mouth daily. (Patient taking differently: Take 10 mg by mouth in the morning, at noon, and at bedtime.)     levothyroxine (SYNTHROID) 75 MCG tablet Take 75 mcg by mouth daily.     losartan  (COZAAR ) 100 MG tablet Take 1 tablet (100 mg total) by mouth daily. 90 tablet 3   potassium chloride SA (KLOR-CON M) 20 MEQ tablet Take 20 mEq by mouth 2 (two) times daily.     predniSONE  (DELTASONE ) 10 MG tablet Take 4 tabs  daily with food x 4 days, then 3 tabs daily x 4 days, then 2 tabs daily x 4 days, then 1 tab daily x4 days then stop. #40 40 tablet 0   rosuvastatin (CRESTOR) 10 MG tablet Take 10 mg by mouth daily.     bisoprolol  (ZEBETA ) 5 MG tablet Take 1 tablet (5 mg  total) by mouth daily. 90 tablet 3   furosemide (LASIX) 40 MG tablet Take 40 mg by mouth daily as needed. (Patient not taking: Reported on 07/21/2024)     No facility-administered medications prior to visit.     Review of Systems:   Constitutional:   No  weight loss, night sweats,  Fevers, chills, fatigue, or  lassitude.  HEENT:   No headaches,  Difficulty swallowing,  Tooth/dental problems, or  Sore throat,                No sneezing, itching, ear ache, nasal congestion, post nasal drip,   CV:  No chest pain,  Orthopnea, PND, swelling in lower extremities, anasarca, dizziness, palpitations, syncope.   GI  No heartburn, indigestion, abdominal pain, nausea, vomiting, diarrhea, change in bowel habits,  loss of appetite, bloody stools.   Resp: No shortness of breath with exertion or at rest.  No excess mucus, no productive cough,  No non-productive cough,  No coughing up of blood.  No change in color of mucus.  No wheezing.  No chest wall deformity  Skin: no rash or lesions.  GU: no dysuria, change in color of urine, no urgency or frequency.  No flank pain, no hematuria   MS:  No joint pain or swelling.  No decreased range of motion.  No back pain.    Physical Exam  BP (!) 152/82   Pulse 63   Temp 98.6 F (37 C)   Ht 5' (1.524 m)   Wt 126 lb 6.4 oz (57.3 kg)   SpO2 92% Comment: 4L POC  BMI 24.69 kg/m   GEN: A/Ox3; pleasant , NAD, well nourished    HEENT:  [place]/AT,  EACs-clear, TMs-wnl, NOSE-clear, THROAT-clear, no lesions, no postnasal drip or exudate noted.   NECK:  Supple w/ fair ROM; no JVD; normal carotid impulses w/o bruits; no thyromegaly or nodules palpated; no lymphadenopathy.    RESP  Clear  P & A; w/o, wheezes/ rales/ or rhonchi. no accessory muscle use, no dullness to percussion  CARD:  RRR, no m/r/g, no peripheral edema, pulses intact, no cyanosis or clubbing.  GI:   Soft & nt; nml bowel sounds; no organomegaly or masses detected.   Musco: Warm bil, no  deformities or joint swelling noted.   Neuro: alert, no focal deficits noted.    Skin: Warm, no lesions or rashes    Lab Results:  CBC No results found for: WBC, RBC, HGB, HCT, PLT, MCV, MCH, MCHC, RDW, LYMPHSABS, MONOABS, EOSABS, BASOSABS  BMET No results found for: NA, K, CL, CO2, GLUCOSE, BUN, CREATININE, CALCIUM, GFRNONAA, GFRAA  BNP No results found for: BNP  ProBNP No results found for: PROBNP  Imaging: No results found.  Administration History     None          Latest Ref Rng & Units 03/12/2023    2:29 PM  PFT Results  FVC-Pre L 1.66   FVC-Predicted Pre % 74   FVC-Post L 1.76   FVC-Predicted Post % 78   Pre FEV1/FVC % % 33   Post FEV1/FCV % % 30   FEV1-Pre L 0.54   FEV1-Predicted Pre % 32   FEV1-Post L 0.53   DLCO uncorrected ml/min/mmHg 3.56   DLCO UNC% % 22   DLVA Predicted % 31   TLC L 6.15   TLC % Predicted % 142   RV % Predicted % 215     No results found for: NITRICOXIDE      Assessment & Plan:   Assessment and Plan Assessment & Plan Chronic obstructive pulmonary disease (COPD) with emphysema and pulmonary scarring   COPD symptoms are worsening, with increased dyspnea and significant effort required for ambulation. A recent exacerbation was managed with prednisone . Current treatment includes Breztri inhaler and albuterol  nebulizer, with oxygen  therapy at 4 liters. Recent stress from her sister's passing may contribute to symptoms. Continue Breztri inhaler, two puffs in the morning and evening. Add Ohtuvayre  nebulizer treatment twice daily. Continue oxygen  therapy at 4 liters. Prescribe Augmentin for potential infection. Taper prednisone  to 5 mg after completing the current course of 10 mg.  Pulmonary hypertension   Pulmonary hypertension is likely secondary to heart disease and COPD, contributing to dyspnea. Monitor pulmonary hypertension in conjunction with heart failure  management.  Congestive  heart failure with cardiomyopathy   Congestive heart failure with cardiomyopathy is present, but she is not currently in heart failure, though pump function is decreased. Managed with bisoprolol  and Cozaar . Dyspnea may be partially attributed to the heart condition. Continue bisoprolol  and Cozaar  as prescribed. Monitor for symptoms of heart failure.  Resolving left lung nodule with scarring   The left lung nodule is decreasing in size and resolving, likely due to inflammation or infection rather than malignancy. Scarring is present in the left lung, possibly from previous radiation therapy for breast cancer.        Madelin Stank, NP 07/21/2024

## 2024-07-23 ENCOUNTER — Telehealth: Payer: Self-pay

## 2024-07-23 NOTE — Telephone Encounter (Signed)
 Received fax from [facility] that enrollment form was received.   Patient ID: [id number]

## 2024-07-23 NOTE — Progress Notes (Signed)
 Stacy Johnson                                          MRN: [id number]   07/23/2024   The [facility] Quality Team Specialist reviewed this patient medical record for the purposes of chart review for care gap closure. The following were reviewed: chart review for care gap closure-controlling blood pressure.    [facility] Quality Team

## 2024-07-23 NOTE — Telephone Encounter (Deleted)
 Received fax from [facility] confirming receipt of patient enrollment form.  Patient ID: [id number]

## 2024-07-23 NOTE — Telephone Encounter (Signed)
 Received Ohtuvayre  new start paperwork. Completed form and faxed with clinicals and insurance card copy to [facility]   Phone#: [phone] Fax#: [phone]

## 2024-07-28 NOTE — Telephone Encounter (Signed)
 Received fax from [facility] confirming receipt of prescription.

## 2024-07-30 ENCOUNTER — Telehealth: Payer: Self-pay

## 2024-07-30 DIAGNOSIS — Z008 Encounter for other general examination: Secondary | ICD-10-CM | POA: Diagnosis not present

## 2024-07-30 NOTE — Telephone Encounter (Signed)
 Copied from CRM #[id number]. Topic: Clinical - Medication Question >> Jul 30, 2024  2:27 PM Stacy Johnson wrote: Reason for CRM: Patient is calling regarding the Ensifentrine  (OHTUVAYRE ) 3 MG/2.5ML SUSP [[id number]], she said the [facility] cost is way too high and is requesting an alternative medication with a lower cost. Leave a VM if no answer.  Routing to the RX team.

## 2024-07-31 NOTE — Telephone Encounter (Signed)
 Addressed in specialty biv encounter

## 2024-07-31 NOTE — Telephone Encounter (Signed)
 Okay thanks we will have to discuss this at follow up and try again at beginning of year.  Thanks so much for your help

## 2024-07-31 NOTE — Telephone Encounter (Signed)
 Called the pt in regard to telephone encounter on 10/16. She states the Ohtuvayre  is unaffordable, she attempted to apply for financial assistance through [employer] but is ineligible because she has not paid $2000 in medical expenses this year. She stated she has about $500 left to meet this. COPD grant is not currently open so there are no other financial assistance options available at this time.  I mentioned M3P to pt because she also pays over $100 a month for Breztri, but it is probably not suitable for her to enroll at this time because we are close to the end of the year. I suggested we revisit her enrolling at the beginning of the year.  Tammy are there any alternatives you would like to consider? If not we can wait for the COPD grant to open or look into doing M3P next year

## 2024-08-01 DIAGNOSIS — R0902 Hypoxemia: Secondary | ICD-10-CM | POA: Diagnosis not present

## 2024-08-01 DIAGNOSIS — R600 Localized edema: Secondary | ICD-10-CM | POA: Diagnosis not present

## 2024-08-05 DIAGNOSIS — I700 Atherosclerosis of aorta: Secondary | ICD-10-CM | POA: Diagnosis not present

## 2024-08-05 DIAGNOSIS — E871 Hypo-osmolality and hyponatremia: Secondary | ICD-10-CM | POA: Diagnosis not present

## 2024-08-05 DIAGNOSIS — J441 Chronic obstructive pulmonary disease with (acute) exacerbation: Secondary | ICD-10-CM | POA: Diagnosis not present

## 2024-08-05 DIAGNOSIS — E782 Mixed hyperlipidemia: Secondary | ICD-10-CM | POA: Diagnosis not present

## 2024-08-05 DIAGNOSIS — K625 Hemorrhage of anus and rectum: Secondary | ICD-10-CM | POA: Diagnosis not present

## 2024-08-05 DIAGNOSIS — I482 Chronic atrial fibrillation, unspecified: Secondary | ICD-10-CM | POA: Diagnosis not present

## 2024-08-05 DIAGNOSIS — E1169 Type 2 diabetes mellitus with other specified complication: Secondary | ICD-10-CM | POA: Diagnosis not present

## 2024-08-05 DIAGNOSIS — F339 Major depressive disorder, recurrent, unspecified: Secondary | ICD-10-CM | POA: Diagnosis not present

## 2024-08-05 DIAGNOSIS — I10 Essential (primary) hypertension: Secondary | ICD-10-CM | POA: Diagnosis not present

## 2024-08-05 DIAGNOSIS — E039 Hypothyroidism, unspecified: Secondary | ICD-10-CM | POA: Diagnosis not present

## 2024-08-05 DIAGNOSIS — R918 Other nonspecific abnormal finding of lung field: Secondary | ICD-10-CM | POA: Diagnosis not present

## 2024-08-05 DIAGNOSIS — I5020 Unspecified systolic (congestive) heart failure: Secondary | ICD-10-CM | POA: Diagnosis not present

## 2024-08-09 DIAGNOSIS — J9601 Acute respiratory failure with hypoxia: Secondary | ICD-10-CM | POA: Diagnosis not present

## 2024-08-14 DIAGNOSIS — E782 Mixed hyperlipidemia: Secondary | ICD-10-CM | POA: Diagnosis not present

## 2024-08-14 DIAGNOSIS — I10 Essential (primary) hypertension: Secondary | ICD-10-CM | POA: Diagnosis not present

## 2024-08-14 DIAGNOSIS — I5020 Unspecified systolic (congestive) heart failure: Secondary | ICD-10-CM | POA: Diagnosis not present

## 2024-08-14 DIAGNOSIS — I482 Chronic atrial fibrillation, unspecified: Secondary | ICD-10-CM | POA: Diagnosis not present

## 2024-09-01 DIAGNOSIS — J449 Chronic obstructive pulmonary disease, unspecified: Secondary | ICD-10-CM | POA: Diagnosis not present

## 2024-09-01 DIAGNOSIS — R0902 Hypoxemia: Secondary | ICD-10-CM | POA: Diagnosis not present

## 2024-09-01 DIAGNOSIS — R600 Localized edema: Secondary | ICD-10-CM | POA: Diagnosis not present

## 2024-09-02 DIAGNOSIS — I5020 Unspecified systolic (congestive) heart failure: Secondary | ICD-10-CM | POA: Diagnosis not present

## 2024-09-02 DIAGNOSIS — J441 Chronic obstructive pulmonary disease with (acute) exacerbation: Secondary | ICD-10-CM | POA: Diagnosis not present

## 2024-09-02 DIAGNOSIS — E1169 Type 2 diabetes mellitus with other specified complication: Secondary | ICD-10-CM | POA: Diagnosis not present

## 2024-09-02 DIAGNOSIS — R918 Other nonspecific abnormal finding of lung field: Secondary | ICD-10-CM | POA: Diagnosis not present

## 2024-09-02 DIAGNOSIS — I700 Atherosclerosis of aorta: Secondary | ICD-10-CM | POA: Diagnosis not present

## 2024-09-02 DIAGNOSIS — I482 Chronic atrial fibrillation, unspecified: Secondary | ICD-10-CM | POA: Diagnosis not present

## 2024-09-02 DIAGNOSIS — I10 Essential (primary) hypertension: Secondary | ICD-10-CM | POA: Diagnosis not present

## 2024-09-02 DIAGNOSIS — F339 Major depressive disorder, recurrent, unspecified: Secondary | ICD-10-CM | POA: Diagnosis not present

## 2024-09-02 DIAGNOSIS — K625 Hemorrhage of anus and rectum: Secondary | ICD-10-CM | POA: Diagnosis not present

## 2024-09-02 DIAGNOSIS — E871 Hypo-osmolality and hyponatremia: Secondary | ICD-10-CM | POA: Diagnosis not present

## 2024-09-02 DIAGNOSIS — E782 Mixed hyperlipidemia: Secondary | ICD-10-CM | POA: Diagnosis not present

## 2024-09-02 DIAGNOSIS — E039 Hypothyroidism, unspecified: Secondary | ICD-10-CM | POA: Diagnosis not present

## 2024-09-03 ENCOUNTER — Ambulatory Visit (HOSPITAL_BASED_OUTPATIENT_CLINIC_OR_DEPARTMENT_OTHER): Admitting: Pulmonary Disease

## 2024-09-04 NOTE — Telephone Encounter (Signed)
 Called [facility] and provided grant info, they stated everything looked good and pt can reach out to schedule shipment.  Called pt and provided her with phone number to [facility] [phone] so she can request her fill. Also provided her with the processing info for the grant so she can try to use it for her Breztri . Advised pt when new year starts her copays will likely go up again so she may want to be mindful of how she uses the funds. Pt was very grateful that we didn't forget about her and will call to fill her Ohtuvayre  now. NFN

## 2024-09-04 NOTE — Telephone Encounter (Signed)
 Pt enrolled in COPD Grant through [employer]:  Amount: $1800 Award Period: 03/08/24-09/04/25 BIN: [id number] PCN: [id number] Group: [id number] ID: [id number]  For pharmacy inquiries, contact [employer] at [phone]. For patient inquiries, contact [employer] at [phone].  The patient provided consent for the team to submit a grant application on their behalf if funding opportunities become available. They understand that wait times may vary depending on fund availability, and that placement on the waitlist does not guarantee that funding will be awarded.

## 2024-09-09 DIAGNOSIS — J9601 Acute respiratory failure with hypoxia: Secondary | ICD-10-CM | POA: Diagnosis not present

## 2024-09-13 DIAGNOSIS — I482 Chronic atrial fibrillation, unspecified: Secondary | ICD-10-CM | POA: Diagnosis not present

## 2024-09-13 DIAGNOSIS — I10 Essential (primary) hypertension: Secondary | ICD-10-CM | POA: Diagnosis not present

## 2024-09-13 DIAGNOSIS — E782 Mixed hyperlipidemia: Secondary | ICD-10-CM | POA: Diagnosis not present

## 2024-09-13 DIAGNOSIS — I5020 Unspecified systolic (congestive) heart failure: Secondary | ICD-10-CM | POA: Diagnosis not present

## 2024-09-14 ENCOUNTER — Ambulatory Visit: Admitting: Adult Health

## 2024-09-14 ENCOUNTER — Encounter: Payer: Self-pay | Admitting: Adult Health

## 2024-09-14 VITALS — BP 171/86 | HR 76 | Temp 98.0°F | Ht 61.0 in | Wt 136.2 lb

## 2024-09-14 DIAGNOSIS — I429 Cardiomyopathy, unspecified: Secondary | ICD-10-CM | POA: Diagnosis not present

## 2024-09-14 DIAGNOSIS — J4489 Other specified chronic obstructive pulmonary disease: Secondary | ICD-10-CM

## 2024-09-14 DIAGNOSIS — H6122 Impacted cerumen, left ear: Secondary | ICD-10-CM

## 2024-09-14 DIAGNOSIS — I5022 Chronic systolic (congestive) heart failure: Secondary | ICD-10-CM

## 2024-09-14 DIAGNOSIS — I2729 Other secondary pulmonary hypertension: Secondary | ICD-10-CM

## 2024-09-14 DIAGNOSIS — R911 Solitary pulmonary nodule: Secondary | ICD-10-CM

## 2024-09-14 DIAGNOSIS — J9611 Chronic respiratory failure with hypoxia: Secondary | ICD-10-CM

## 2024-09-14 DIAGNOSIS — I4891 Unspecified atrial fibrillation: Secondary | ICD-10-CM | POA: Diagnosis not present

## 2024-09-14 DIAGNOSIS — J439 Emphysema, unspecified: Secondary | ICD-10-CM

## 2024-09-14 DIAGNOSIS — J441 Chronic obstructive pulmonary disease with (acute) exacerbation: Secondary | ICD-10-CM

## 2024-09-14 MED ORDER — PREDNISONE 10 MG PO TABS: 10.0000 mg | ORAL_TABLET | Freq: Every day | ORAL | 0 refills | Status: DC

## 2024-09-14 MED ORDER — BREZTRI AEROSPHERE 160-9-4.8 MCG/ACT IN AERO: 2.0000 | INHALATION_SPRAY | Freq: Two times a day (BID) | RESPIRATORY_TRACT | Status: AC

## 2024-09-14 NOTE — Patient Instructions (Addendum)
 Start Ohtuvayre  neb Twice daily Increase Prednisone  10mg  daily for 2 weeks and then Prednisone  5mg  daily -hold at this dose.  Continue on Breztri  2 puffs Twice daily, rinse after use.  Albuterol  inhaler or neb As needed   Continue on Oxygen  3-4l/m. Follow up with Dr. Jude  or Rosalene Wardrop NP in 4-6 weeks or As needed   Please contact office for sooner follow up if symptoms do not improve or worsen or seek emergency care

## 2024-09-14 NOTE — Progress Notes (Addendum)
 "  @Patient  ID: Stacy Johnson, female    DOB: December 29, 1945, 78 y.o.   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  repeated CT chest without contrast 07/30/2023 which shows left lower lobe lesion is smaller with air bronchograms?  Resolving inflammation.  Left upper lobe 12 mm nodule is stable and dates back to 2018   PFTs 02/2023 ratio 33, FEV1 32%, FVC 74%, TLC 140%, DLCO 3.56/22%   11/09/22 On ambulation saturation dropped from 90% to 84% and required 3 L of oxygen  to recover and maintain while walking.    06/2023 CT chest >>  enlarged paraspinal left lower lobe pulmonary nodule, now 2.6 x 1.4 cm, other 2 nodules are stable, new T5 compression fracture     03/2024 Ct chest wo con >> resolving LLL nodule, stable LUL nodule   11/2022 CT chest without con>> left upper lobe subpleural nodule slightly increased in size to 11 x 8 mm, previously 9 x 6 mm, new left lower lobe nodule 5 mm, new groundglass opacity right lower lobe with right upper paratracheal lymph node subcentimeter   02/2018 CT chest >> severe apical bullous emphysema, peripheral left upper lobe pulmonary nodule   02/15/2021 Walked 2lpm approx 100 ft @ moderate pace stopped due to pulse 170 No sob with sats still 98% Vs sats 86% RA (clinically 02 dep since 11/2020      Echo 12/2022 EF 45 to 50%, global hypokinesis, RVSP 38   Echo 12/2021 grade 2 diastolic dysfunction, EF 55 to 60%, RVSP 58 ,  moderate TR, moderate to severe pulm hypertension     Discussed the use of AI scribe software for clinical note transcription with the patient, who gave verbal consent to proceed.  History of Present Illness Stacy Johnson is a 78 year old female with COPD, emphysema, and pulmonary hypertension who presents for follow up .   Patient says overall breathing is doing slightly better.  But she continues to have shortness of breath with minimum activity.  She feels fine while sitting but experiences shortness of breath upon exertion, such as walking. She has been on prednisone , initially at 10 mg, then reduced to 5 mg. She recently started a new nebulizer medication, Ohtuvayre  3 days ago.  She remains on Breztri  twice daily.  She is on oxygen  3 to 4 L.  She has a daily cough, minimally productive.  Has some intermittent wheezing.  Does feel her breathing was better when she was on the prednisone  10 mg daily.   She has a history of congestive heart failure, atrial fibrillation, and cardiomyopathy.  She is followed by cardiology.  She mentions ear trouble, specifically wax buildup, and uses wax medicine to manage it.   Does feel that she gets anxious and overwhelmed at times.  She is on Prozac but does not feel that it is working.  She is seeing her primary care doctor later this week   She has gained weight,  which she attributes to prednisone  use, appetite is good.  Declines flu shot.     No Known Allergies  There is no immunization history for the selected administration types on file for this patient.  Past Medical History:  Diagnosis Date   A-fib (HCC)    Anxiety disorder    Body mass index 26.0-26.9, adult    Breast cancer (HCC) 2005   lumpectomy and xrt   Chronic CHF (congestive heart failure) (HCC)    Chronic obstructive pulmonary disease (COPD) (HCC)    CKD (chronic kidney disease)    Gangrene (HCC)    Hyperlipidemia    Hypertension    Hypothyroid    Localized edema     Pulmonary hypertension (HCC)     Tobacco History: Social History   Tobacco Use  Smoking Status Former   Current packs/day: 0.00   Average packs/day: 1 pack/day for 46.0 years (46.0 ttl pk-yrs)   Types: Cigarettes   Start date: 10/15/1961   Quit date: 10/16/2007   Years since quitting: 16.9  Smokeless Tobacco Never   Counseling given: Not Answered   Outpatient Medications Prior to Visit  Medication Sig Dispense Refill   albuterol  (ACCUNEB ) 1.25 MG/3ML nebulizer solution Take 1 ampule by nebulization every 6 (six) hours as needed for wheezing.     albuterol  (VENTOLIN  HFA) 108 (90 Base) MCG/ACT inhaler SMARTSIG:1-2 Puff(s) By Mouth Every 6 Hours PRN     aspirin EC 81 MG tablet Take 81 mg by mouth daily. Swallow whole.     bisoprolol  (ZEBETA ) 5 MG tablet Take 1 tablet (5 mg total) by mouth daily. 90 tablet 3   budesonide-glycopyrrolate-formoterol (BREZTRI  AEROSPHERE) 160-9-4.8 MCG/ACT AERO inhaler Inhale 2 puffs into the lungs in the morning and at bedtime.     Ensifentrine  (OHTUVAYRE ) 3 MG/2.5ML SUSP Inhale 1 Inhalation into the lungs 2 (two) times daily. 150 mL 11   levothyroxine (SYNTHROID) 75 MCG tablet Take 75 mcg by mouth daily.     losartan  (COZAAR ) 100 MG tablet Take 1 tablet (100 mg total) by mouth daily. 90 tablet 3   potassium chloride SA (KLOR-CON M) 20 MEQ tablet Take 20 mEq by mouth 2 (two) times daily. (Patient taking differently: Take 20 mEq by mouth daily.)     predniSONE  (DELTASONE ) 5 MG tablet Take 1 tablet (5 mg total) by mouth daily with breakfast. 30 tablet 2   rosuvastatin (CRESTOR) 10 MG tablet Take 10 mg by mouth daily.     budesonide-glycopyrrolate-formoterol (BREZTRI  AEROSPHERE) 160-9-4.8 MCG/ACT AERO inhaler Inhale 2 puffs into the lungs 2 (two) times daily. (Patient not taking: Reported on 09/14/2024)     FLUoxetine (PROZAC) 10 MG tablet Take 10 mg by mouth daily. (Patient not taking: Reported on 09/14/2024)     furosemide (LASIX) 40 MG tablet Take 40 mg by mouth  daily as needed. (Patient not taking: Reported on 09/14/2024)     amoxicillin -clavulanate (AUGMENTIN ) 875-125 MG tablet Take 1 tablet by mouth 2 (two) times daily. (Patient not taking: Reported on 09/14/2024) 14 tablet 0   predniSONE  (DELTASONE ) 10 MG tablet Take 4 tabs  daily with food x 4 days, then 3 tabs daily x 4 days, then 2 tabs daily x 4 days, then 1 tab daily x4 days then stop. #40 (Patient not taking: Reported on 09/14/2024) 40 tablet 0   No facility-administered medications prior to visit.     Review of Systems:   Constitutional:   No  weight loss, night sweats,  Fevers, chills, +fatigue, or  lassitude.  HEENT:   No headaches,  Difficulty swallowing,  Tooth/dental problems, or  Sore throat,                No sneezing, itching, ear ache, nasal congestion, post nasal drip,   CV:  No chest pain,  Orthopnea, PND, swelling in lower extremities, anasarca, dizziness, palpitations, syncope.   GI  No heartburn, indigestion, abdominal pain, nausea, vomiting, diarrhea, change in bowel habits, loss of appetite, bloody stools.   Resp:   No chest wall deformity  Skin: no rash or lesions.  GU: no dysuria, change in color of urine, no urgency or frequency.  No flank pain, no hematuria   MS:  No joint pain or swelling.  No decreased range of motion.  No back pain.    Physical Exam  BP (!) 171/86 Comment: rechecked due to elevated bp at initial reading  Pulse 76   Temp 98 F (36.7 C)   Ht 5' 1 (1.549 m) Comment: Per pt  Wt 136 lb 3.2 oz (61.8 kg)   SpO2 96% Comment: 4L Pulse o2  BMI 25.73 kg/m   GEN: A/Ox3; pleasant , NAD, well nourished    HEENT:  [place]/AT,  EACs-cerumen impaction on the left TMs-wnl, NOSE-clear, THROAT-clear, no lesions, no postnasal drip or exudate noted.   NECK:  Supple w/ fair ROM; no JVD; normal carotid impulses w/o bruits; no thyromegaly or nodules palpated; no lymphadenopathy.    RESP few scattered rhonchi . no accessory muscle use, no dullness to  percussion  CARD:  RRR, no m/r/g, no peripheral edema, pulses intact, no cyanosis or clubbing.  GI:   Soft & nt; nml bowel sounds; no organomegaly or masses detected.   Musco: Warm bil, no deformities or joint swelling noted.   Neuro: alert, no focal deficits noted.    Skin: Warm, no lesions or rashes    Lab Results:Reviewed 09/14/2024   CBC No results found for: WBC, RBC, HGB, HCT, PLT, MCV, MCH, MCHC, RDW, LYMPHSABS, MONOABS, EOSABS, BASOSABS  BMET No results found for: NA, K, CL, CO2, GLUCOSE, BUN, CREATININE, CALCIUM, GFRNONAA, GFRAA  BNP No results found for: BNP  ProBNP No results found for: PROBNP  Imaging: No results found.  Administration History     None          Latest Ref Rng & Units 03/12/2023    2:29 PM  PFT Results  FVC-Pre L 1.66   FVC-Predicted Pre % 74   FVC-Post L 1.76   FVC-Predicted Post % 78   Pre FEV1/FVC % % 33   Post FEV1/FCV % % 30   FEV1-Pre L 0.54   FEV1-Predicted Pre % 32   FEV1-Post L 0.53   DLCO uncorrected ml/min/mmHg 3.56   DLCO UNC% % 22   DLVA Predicted % 31   TLC L 6.15   TLC % Predicted % 142   RV % Predicted % 215     No results found for: NITRICOXIDE      No data to display              Assessment & Plan:   Assessment and Plan Assessment & Plan Chronic obstructive pulmonary disease (COPD) with emphysema -significant daily symptom burden Severe COPD with emphysema complicated by chronic respiratory failure and pulmonary hypertension . Lung function is severely reduced with a capacity of approximately 32%. Current treatment includes oxygen  therapy and Breztri .  Ohtuvayre  neb have been started. . Increase prednisone  to 10 mg daily for two  weeks, then reduce to 5 mg daily.  . Monitor response to the new nebulizer medication. A follow-up is scheduled in one month to reassess symptoms and medication efficacy.  Hopefully will be able to taper prednisone  to off  in near future.   Pulmonary hypertension-mildly elevated most likely WHO group 2 and 3-continue on oxygen  to maintain O2 saturations greater than 90%,  avoid volume overload   Congestive heart failure with cardiomyopathy and atrial fibrillation  -appears compensated and euvolemic on exam Continue on current maintenance regimen follow-up with cardiology  Cerumen impaction, left ear   Cerumen impaction in the left ear causes discomfort. She has been using wax softening drops. Continue using wax softening drops or peroxide in the left ear. Avoid using Q-tips in the ear.  Refer to ENT if needed  Lung nodularity-CT chest June 2025 showed decrease in in size likely due to resolving inflammation/infection.  Continue with yearly CT surveillance CT  Plan  Patient Instructions  Start Ohtuvayre  neb Twice daily Increase Prednisone  10mg  daily for 2 weeks and then Prednisone  5mg  daily -hold at this dose.  Continue on Breztri  2 puffs Twice daily, rinse after use.  Albuterol  inhaler or neb As needed   Continue on Oxygen  3-4l/m. Follow up with Dr. Jude  or Catheryne Deford NP in 4-6 weeks or As needed   Please contact office for sooner follow up if symptoms do not improve or worsen or seek emergency care              Teron Blais, NP 09/14/2024  I spent 36  minutes dedicated to the care of this patient on the date of this encounter to include pre-visit review of records, face-to-face time with the patient discussing conditions above, post visit ordering of testing, clinical documentation with the electronic health record, making appropriate referrals as documented, and communicating necessary findings to members of the patients care team.  "

## 2024-09-21 DIAGNOSIS — J449 Chronic obstructive pulmonary disease, unspecified: Secondary | ICD-10-CM | POA: Diagnosis not present

## 2024-09-29 DIAGNOSIS — E1169 Type 2 diabetes mellitus with other specified complication: Secondary | ICD-10-CM | POA: Diagnosis not present

## 2024-09-29 DIAGNOSIS — E871 Hypo-osmolality and hyponatremia: Secondary | ICD-10-CM | POA: Diagnosis not present

## 2024-09-29 DIAGNOSIS — I482 Chronic atrial fibrillation, unspecified: Secondary | ICD-10-CM | POA: Diagnosis not present

## 2024-09-29 DIAGNOSIS — R918 Other nonspecific abnormal finding of lung field: Secondary | ICD-10-CM | POA: Diagnosis not present

## 2024-09-29 DIAGNOSIS — I5020 Unspecified systolic (congestive) heart failure: Secondary | ICD-10-CM | POA: Diagnosis not present

## 2024-09-29 DIAGNOSIS — E039 Hypothyroidism, unspecified: Secondary | ICD-10-CM | POA: Diagnosis not present

## 2024-09-29 DIAGNOSIS — K625 Hemorrhage of anus and rectum: Secondary | ICD-10-CM | POA: Diagnosis not present

## 2024-09-29 DIAGNOSIS — I700 Atherosclerosis of aorta: Secondary | ICD-10-CM | POA: Diagnosis not present

## 2024-09-29 DIAGNOSIS — E782 Mixed hyperlipidemia: Secondary | ICD-10-CM | POA: Diagnosis not present

## 2024-09-29 DIAGNOSIS — F339 Major depressive disorder, recurrent, unspecified: Secondary | ICD-10-CM | POA: Diagnosis not present

## 2024-09-29 DIAGNOSIS — J441 Chronic obstructive pulmonary disease with (acute) exacerbation: Secondary | ICD-10-CM | POA: Diagnosis not present

## 2024-09-29 DIAGNOSIS — I10 Essential (primary) hypertension: Secondary | ICD-10-CM | POA: Diagnosis not present

## 2024-10-01 DIAGNOSIS — J449 Chronic obstructive pulmonary disease, unspecified: Secondary | ICD-10-CM | POA: Diagnosis not present

## 2024-10-01 DIAGNOSIS — R0902 Hypoxemia: Secondary | ICD-10-CM | POA: Diagnosis not present

## 2024-10-01 DIAGNOSIS — R600 Localized edema: Secondary | ICD-10-CM | POA: Diagnosis not present

## 2024-10-09 ENCOUNTER — Ambulatory Visit: Payer: Self-pay | Admitting: Adult Health

## 2024-10-09 DIAGNOSIS — J9601 Acute respiratory failure with hypoxia: Secondary | ICD-10-CM | POA: Diagnosis not present

## 2024-10-09 NOTE — Telephone Encounter (Signed)
 FYI Only or Action Required?: Action required by provider: clinical question for provider and update on patient condition.  Patient is followed in Pulmonology for COPD, last seen on 09/14/2024 by Parrett, Madelin RAMAN, NP.  Called Nurse Triage reporting Shortness of Breath.  Symptoms began several days ago.  Interventions attempted: Prescription medications: prednisone  5mg , Rescue inhaler, Maintenance inhaler, and Nebulizer treatments.  Symptoms are: gradually worsening.  Triage Disposition: See HCP Within 4 Hours (Or PCP Triage)  Patient/caregiver understands and will follow disposition?: No, wishes to speak with PCP    Copied from CRM #[id number]. Topic: Clinical - Red Word Triage >> Oct 09, 2024 12:55 PM Russell PARAS wrote: Red Word that prompted transfer to Nurse Triage:   History of COPD Finished taking 10 mg prednisone  course recently Symptoms were managed well with medication. However, SOB has worsened now that she is off the prednisone  No wheezing  Requesting prednisone  be called in  Pt of Parrett   Reason for Disposition  [1] Longstanding difficulty breathing AND [2] not responding to usual therapy  Answer Assessment - Initial Assessment Questions Pt called in requesting medical advise regarding SOB r/t COPD. Pt reports completing recent rx of Pred 10mg  and symptoms were improved but now SOB is worsened. Pt reports taking pred 5mg  daily as maintenance but it does not seem to be as effective. Pt questioned if she could double rx and take 10mg ; reassured her I would send request to provider. Pt denies any distress, change in O2 sat, no fever or chest pain. Pt is still using inhalers and nebulizer as directed. Please advise.     1. RESPIRATORY STATUS: Describe your breathing? (e.g., wheezing, shortness of breath, unable to speak, severe coughing)      SOB worsening when walking room to room   2. ONSET: When did this breathing problem begin?      Noticed over the past couple  days   3. PATTERN Does the difficult breathing come and go, or has it been constant since it started?      Comes and goes   4. SEVERITY: How bad is your breathing? (e.g., mild, moderate, severe)      Mild  5. RECURRENT SYMPTOM: Have you had difficulty breathing before? If Yes, ask: When was the last time? and What happened that time?      Yes; hx of COPD    7. LUNG HISTORY: Do you have any history of lung disease?  (e.g., pulmonary embolus, asthma, emphysema)     COPD   8. CAUSE: What do you think is causing the breathing problem?      Unknown   9. OTHER SYMPTOMS: Do you have any other symptoms? (e.g., chest pain, cough, dizziness, fever, runny nose)     No   10. O2 SATURATION MONITOR:  Do you use an oxygen  saturation monitor (pulse oximeter) at home? If Yes, ask: What is your reading (oxygen  level) today? What is your usual oxygen  saturation reading? (e.g., 95%)       Yes; Reports no change in O2 sat or BP  Protocols used: Breathing Difficulty-A-AH

## 2024-10-10 ENCOUNTER — Other Ambulatory Visit: Payer: Self-pay | Admitting: Adult Health

## 2024-10-12 MED ORDER — PREDNISONE 10 MG PO TABS: 10.0000 mg | ORAL_TABLET | Freq: Every day | ORAL | 1 refills | Status: AC

## 2024-10-12 NOTE — Telephone Encounter (Signed)
 Can increase Prednisone  10mg  daily until seen in back in the office in 2 weeks. Keep follow up OV . Rx sent to pharm.  Please contact office for sooner follow up if symptoms do not improve or worsen or seek emergency care

## 2024-10-13 NOTE — Telephone Encounter (Signed)
 Copied from CRM #[id number]. Topic: General - Other >> Oct 13, 2024 10:20 AM Rilla NOVAK wrote: Reason for CRM: Patient returning call to [facility].  Please call patient [phone].   Pt is aware of Tammy's note and verbalized understanding. Nfn

## 2024-10-13 NOTE — Telephone Encounter (Signed)
ATC x1.  LMTCB. 

## 2024-11-02 ENCOUNTER — Ambulatory Visit: Admitting: Adult Health

## 2024-11-03 ENCOUNTER — Ambulatory Visit: Admitting: Adult Health

## 2024-11-12 ENCOUNTER — Ambulatory Visit: Admitting: Adult Health

## 2024-12-22 ENCOUNTER — Ambulatory Visit: Admitting: Adult Health
